# Patient Record
Sex: Female | Born: 1950 | Race: White | Hispanic: No | Marital: Married | Smoking: Never smoker
Health system: Southern US, Community
[De-identification: ages and names within clinical notes are randomized; demographics above are authoritative.]

## PROBLEM LIST (undated history)

## (undated) DIAGNOSIS — Z9889 Other specified postprocedural states: Secondary | ICD-10-CM

## (undated) DIAGNOSIS — M4800 Spinal stenosis, site unspecified: Secondary | ICD-10-CM

## (undated) DIAGNOSIS — I471 Supraventricular tachycardia, unspecified: Secondary | ICD-10-CM

## (undated) DIAGNOSIS — R112 Nausea with vomiting, unspecified: Secondary | ICD-10-CM

## (undated) DIAGNOSIS — I82409 Acute embolism and thrombosis of unspecified deep veins of unspecified lower extremity: Secondary | ICD-10-CM

## (undated) DIAGNOSIS — R7303 Prediabetes: Secondary | ICD-10-CM

## (undated) HISTORY — PX: ABDOMINAL HYSTERECTOMY: SHX81

## (undated) HISTORY — PX: CARDIAC CATHETERIZATION: SHX172

## (undated) HISTORY — PX: TONSILLECTOMY: SUR1361

---

## 2008-12-07 DIAGNOSIS — J189 Pneumonia, unspecified organism: Secondary | ICD-10-CM

## 2008-12-07 HISTORY — DX: Pneumonia, unspecified organism: J18.9

## 2016-02-05 HISTORY — PX: LAMINOTOMY: SHX998

## 2018-12-15 ENCOUNTER — Other Ambulatory Visit: Payer: Self-pay | Admitting: Neurosurgery

## 2018-12-27 ENCOUNTER — Other Ambulatory Visit: Payer: Self-pay | Admitting: Neurosurgery

## 2018-12-27 DIAGNOSIS — M4316 Spondylolisthesis, lumbar region: Secondary | ICD-10-CM

## 2018-12-29 NOTE — Pre-Procedure Instructions (Signed)
[facility]      [facility] - [place], [place] - [id number] [place] [id number] Phone: [phone] Fax: [phone]    Your procedure is scheduled on January 31st.  [facility] Ad[facility] at 5:30 A.M.  Call this number if you have problems the morning of surgery:  [phone]   Remember:  Do not eat or drink after midnight.     Take these medicines the morning of surgery with A SIP OF WATER   Tylenol - if needed  Atenolol (Tenormin)  Zyrtec - if needed  Gabapentin (Neurtontin)  Meclizine (Antivert) - if needed  Omeprazole (Prilosec)  Ondansetron (Zofran) - if needed   7 days prior to surgery STOP taking any Aspirin (unless otherwise instructed by your surgeon), Aleve, Naproxen, Ibuprofen, Motrin, Advil, Goody's, BC's, all herbal medications, fish oil, and all vitamins.   WHAT DO I DO ABOUT MY DIABETES MEDICATION?   Marland Kitchen. Do not take oral diabetes medicines (pills) the morning of surgery. - Metformin   How to Manage Your Diabetes Before and After Surgery  Why is it important to control my blood sugar before and after surgery? . Improving blood sugar levels before and after surgery helps healing and can limit problems. . A way of improving blood sugar control is eating a healthy diet by: o  Eating less sugar and carbohydrates o  Increasing activity/exercise o  Talking with your doctor about reaching your blood sugar goals . High blood sugars (greater than 180 mg/dL) can raise your risk of infections and slow your recovery, so you will need to focus on controlling your diabetes during the weeks before surgery. . Make sure that the doctor who takes care of your diabetes knows about your planned surgery including the date and location.  How do I manage my blood sugar before surgery? . Check your blood sugar at least 4 times a day, starting 2 days before surgery, to make sure that the level is not  too high or low. o Check your blood sugar the morning of your surgery when you wake up and every 2 hours until you get to the Short Stay unit. . If your blood sugar is less than 70 mg/dL, you will need to treat for low blood sugar: o Do not take insulin. o Treat a low blood sugar (less than 70 mg/dL) with  cup of clear juice (cranberry or apple), 4 glucose tablets, OR glucose gel. o Recheck blood sugar in 15 minutes after treatment (to make sure it is greater than 70 mg/dL). If your blood sugar is not greater than 70 mg/dL on recheck, call [phone] for further instructions. . Report your blood sugar to the short stay nurse when you get to Short Stay.  . If you are admitted to the hospital after surgery: o Your blood sugar will be checked by the staff and you will probably be given insulin after surgery (instead of oral diabetes medicines) to make sure you have good blood sugar levels. o The goal for blood sugar control after surgery is 80-180 mg/dL.      Do not wear jewelry, make-up or nail polish.  Do not wear lotions, powders, or perfumes, or deodorant.  Do not shave 48 hours prior to surgery.    Do not bring valuables to the hospital.  [facility] is not responsible for any belongings or valuables.   [place]- Preparing For Surgery  Before surgery, you can play an important role. Because skin is not sterile, your skin needs to be as free of germs as possible. You can reduce the number of germs on your skin by washing with CHG (chlorahexidine gluconate) Soap before surgery.  CHG is an antiseptic cleaner which kills germs and bonds with the skin to continue killing germs even after washing.    Oral Hygiene is also important to reduce your risk of infection.  Remember - BRUSH YOUR TEETH THE MORNING OF SURGERY WITH YOUR REGULAR TOOTHPASTE  Please do not use if you have an allergy to CHG or antibacterial soaps. If your skin becomes reddened/irritated stop using the CHG.  Do not  shave (including legs and underarms) for at least 48 hours prior to first CHG shower. It is OK to shave your face.  Please follow these instructions carefully.   1. Shower the NIGHT BEFORE SURGERY and the MORNING OF SURGERY with CHG.   2. If you chose to wash your hair, wash your hair first as usual with your normal shampoo.  3. After you shampoo, rinse your hair and body thoroughly to remove the shampoo.  4. Use CHG as you would any other liquid soap. You can apply CHG directly to the skin and wash gently with a scrungie or a clean washcloth.   5. Apply the CHG Soap to your body ONLY FROM THE NECK DOWN.  Do not use on open wounds or open sores. Avoid contact with your eyes, ears, mouth and genitals (private parts). Wash Face and genitals (private parts)  with your normal soap.  6. Wash thoroughly, paying special attention to the area where your surgery will be performed.  7. Thoroughly rinse your body with warm water from the neck down.  8. DO NOT shower/wash with your normal soap after using and rinsing off the CHG Soap.  9. Pat yourself dry with a CLEAN TOWEL.  10. Wear CLEAN PAJAMAS to bed the night before surgery, wear comfortable clothes the morning of surgery  11. Place CLEAN SHEETS on your bed the night of your first shower and DO NOT SLEEP WITH PETS.   Day of Surgery:  Do not apply any deodorants/lotions.  Please wear clean clothes to the hospital/surgery center.   Remember to brush your teeth WITH YOUR REGULAR TOOTHPASTE.   Contacts, dentures or bridgework may not be worn into surgery.  Leave your suitcase in the car.  After surgery it may be brought to your room.  For patients admitted to the hospital, discharge time will be determined by your treatment team.  Patients discharged the day of surgery will not be allowed to drive home.   Please read over the following fact sheets that you were given. Coughing and Deep Breathing, MRSA Information and Surgical Site  Infection Prevention

## 2018-12-30 ENCOUNTER — Encounter (HOSPITAL_COMMUNITY)
Admission: RE | Admit: 2018-12-30 | Discharge: 2018-12-30 | Disposition: A | Discharge status: Home / Self Care | Payer: Medicare HMO | Source: Ambulatory Visit | Attending: Neurosurgery | Admitting: Neurosurgery

## 2019-01-03 ENCOUNTER — Ambulatory Visit
Admission: RE | Admit: 2019-01-03 | Discharge: 2019-01-03 | Disposition: A | Discharge status: Home / Self Care | Payer: Medicare HMO | Source: Ambulatory Visit | Attending: Neurosurgery | Admitting: Neurosurgery

## 2019-01-03 DIAGNOSIS — M4316 Spondylolisthesis, lumbar region: Secondary | ICD-10-CM

## 2019-01-06 ENCOUNTER — Encounter (HOSPITAL_COMMUNITY): Admission: RE | Payer: Self-pay | Source: Home / Self Care

## 2019-01-06 ENCOUNTER — Inpatient Hospital Stay (HOSPITAL_COMMUNITY): Admission: RE | Admit: 2019-01-06 | Payer: Medicare HMO | Source: Home / Self Care | Admitting: Neurosurgery

## 2019-01-06 SURGERY — POSTERIOR LUMBAR FUSION 1 LEVEL
Anesthesia: General | Site: Back

## 2019-10-18 ENCOUNTER — Emergency Department (HOSPITAL_COMMUNITY): Payer: Medicare HMO

## 2019-10-18 ENCOUNTER — Other Ambulatory Visit: Payer: Self-pay

## 2019-10-18 ENCOUNTER — Observation Stay (HOSPITAL_COMMUNITY)
Admission: EM | Admit: 2019-10-18 | Discharge: 2019-10-19 | Disposition: A | Discharge status: Home / Self Care | Payer: Medicare HMO | Attending: Internal Medicine | Admitting: Internal Medicine

## 2019-10-18 ENCOUNTER — Encounter (HOSPITAL_COMMUNITY): Payer: Self-pay

## 2019-10-18 DIAGNOSIS — S335XXA Sprain of ligaments of lumbar spine, initial encounter: Principal | ICD-10-CM

## 2019-10-18 DIAGNOSIS — Z79899 Other long term (current) drug therapy: Secondary | ICD-10-CM | POA: Insufficient documentation

## 2019-10-18 DIAGNOSIS — Z7901 Long term (current) use of anticoagulants: Secondary | ICD-10-CM | POA: Diagnosis not present

## 2019-10-18 DIAGNOSIS — W07XXXA Fall from chair, initial encounter: Secondary | ICD-10-CM | POA: Insufficient documentation

## 2019-10-18 DIAGNOSIS — M549 Dorsalgia, unspecified: Secondary | ICD-10-CM | POA: Diagnosis present

## 2019-10-18 DIAGNOSIS — S0990XA Unspecified injury of head, initial encounter: Secondary | ICD-10-CM | POA: Diagnosis not present

## 2019-10-18 DIAGNOSIS — R7303 Prediabetes: Secondary | ICD-10-CM | POA: Insufficient documentation

## 2019-10-18 DIAGNOSIS — M48061 Spinal stenosis, lumbar region without neurogenic claudication: Secondary | ICD-10-CM | POA: Diagnosis not present

## 2019-10-18 DIAGNOSIS — Z20828 Contact with and (suspected) exposure to other viral communicable diseases: Secondary | ICD-10-CM | POA: Insufficient documentation

## 2019-10-18 DIAGNOSIS — G2581 Restless legs syndrome: Secondary | ICD-10-CM | POA: Diagnosis not present

## 2019-10-18 DIAGNOSIS — I10 Essential (primary) hypertension: Secondary | ICD-10-CM | POA: Diagnosis not present

## 2019-10-18 DIAGNOSIS — E119 Type 2 diabetes mellitus without complications: Secondary | ICD-10-CM

## 2019-10-18 DIAGNOSIS — Z7984 Long term (current) use of oral hypoglycemic drugs: Secondary | ICD-10-CM | POA: Diagnosis not present

## 2019-10-18 DIAGNOSIS — M47816 Spondylosis without myelopathy or radiculopathy, lumbar region: Secondary | ICD-10-CM | POA: Insufficient documentation

## 2019-10-18 DIAGNOSIS — N289 Disorder of kidney and ureter, unspecified: Secondary | ICD-10-CM

## 2019-10-18 DIAGNOSIS — Z86718 Personal history of other venous thrombosis and embolism: Secondary | ICD-10-CM | POA: Diagnosis not present

## 2019-10-18 DIAGNOSIS — G44309 Post-traumatic headache, unspecified, not intractable: Secondary | ICD-10-CM | POA: Insufficient documentation

## 2019-10-18 DIAGNOSIS — W19XXXA Unspecified fall, initial encounter: Secondary | ICD-10-CM

## 2019-10-18 HISTORY — DX: Spinal stenosis, site unspecified: M48.00

## 2019-10-18 HISTORY — DX: Acute embolism and thrombosis of unspecified deep veins of unspecified lower extremity: I82.409

## 2019-10-18 LAB — BASIC METABOLIC PANEL
Anion gap: 11 (ref 5–15)
BUN: 16 mg/dL (ref 8–23)
CO2: 22 mmol/L (ref 22–32)
Calcium: 9.3 mg/dL (ref 8.9–10.3)
Chloride: 105 mmol/L (ref 98–111)
Creatinine, Ser: 1.2 mg/dL — ABNORMAL HIGH (ref 0.44–1.00)
GFR calc Af Amer: 54 mL/min — ABNORMAL LOW (ref >60)
GFR calc non Af Amer: 46 mL/min — ABNORMAL LOW (ref >60)
Glucose, Bld: 116 mg/dL — ABNORMAL HIGH (ref 70–99)
Potassium: 4.6 mmol/L (ref 3.5–5.1)
Sodium: 138 mmol/L (ref 135–145)

## 2019-10-18 LAB — CBC
HCT: 39.9 % (ref 36.0–46.0)
Hemoglobin: 12.4 g/dL (ref 12.0–15.0)
MCH: 26.9 pg (ref 26.0–34.0)
MCHC: 31.1 g/dL (ref 30.0–36.0)
MCV: 86.6 fL (ref 80.0–100.0)
Platelets: 271 10*3/uL (ref 150–400)
RBC: 4.61 MIL/uL (ref 3.87–5.11)
RDW: 15.2 % (ref 11.5–15.5)
WBC: 6.8 10*3/uL (ref 4.0–10.5)
nRBC: 0 % (ref 0.0–0.2)

## 2019-10-18 MED ORDER — ROPINIROLE HCL 1 MG PO TABS
2.0000 mg | ORAL_TABLET | Freq: Once | ORAL | Status: AC
  Administered 2019-10-18: 2 mg via ORAL
  Filled 2019-10-18 (×2): qty 2

## 2019-10-18 MED ORDER — LACTATED RINGERS IV BOLUS
1000.0000 mL | Freq: Once | INTRAVENOUS | Status: AC
  Administered 2019-10-18: 23:00:00 1000 mL via INTRAVENOUS

## 2019-10-18 MED ORDER — FENTANYL CITRATE (PF) 100 MCG/2ML IJ SOLN
50.0000 ug | Freq: Once | INTRAMUSCULAR | Status: AC
  Administered 2019-10-18: 23:00:00 50 ug via INTRAVENOUS
  Filled 2019-10-18: qty 2

## 2019-10-18 MED ORDER — DEXAMETHASONE SODIUM PHOSPHATE 10 MG/ML IJ SOLN
10.0000 mg | Freq: Once | INTRAMUSCULAR | Status: AC
  Administered 2019-10-18: 23:00:00 10 mg via INTRAVENOUS
  Filled 2019-10-18: qty 1

## 2019-10-18 MED ORDER — GADOBUTROL 1 MMOL/ML IV SOLN
8.0000 mL | Freq: Once | INTRAVENOUS | Status: AC | PRN
  Administered 2019-10-18: 8 mL via INTRAVENOUS

## 2019-10-18 MED ORDER — LACTATED RINGERS IV BOLUS
1000.0000 mL | Freq: Once | INTRAVENOUS | Status: AC
  Administered 2019-10-18: 1000 mL via INTRAVENOUS

## 2019-10-18 MED ORDER — FENTANYL CITRATE (PF) 100 MCG/2ML IJ SOLN
50.0000 ug | Freq: Once | INTRAMUSCULAR | Status: AC
  Administered 2019-10-18: 50 ug via INTRAVENOUS
  Filled 2019-10-18: qty 2

## 2019-10-18 MED ORDER — ONDANSETRON HCL 4 MG/2ML IJ SOLN
4.0000 mg | Freq: Once | INTRAMUSCULAR | Status: AC
  Administered 2019-10-18: 4 mg via INTRAVENOUS
  Filled 2019-10-18: qty 2

## 2019-10-18 NOTE — ED Notes (Signed)
Called pharmacy requesting verification of medication

## 2019-10-18 NOTE — ED Notes (Signed)
Patient transported to MRI 

## 2019-10-18 NOTE — ED Triage Notes (Signed)
Pt endorses standing up in a chair at 0845 and the chair slipped out from under her and she fell onto her back striking her lumbar area and the back of her head. No loc. Pt is on eliquis for DVT. Complains of headache with nausea. Pt was dizzy when she first got up out of the floor. VSS

## 2019-10-18 NOTE — ED Provider Notes (Signed)
[facility] EMERGENCY DEPARTMENT Provider Note   CSN: [id number] Arrival date & time: 10/18/19  0957     History   Chief Complaint Chief Complaint  Patient presents with   Head Injury   Fall    HPI Diana Mcdonald is a 68 y.o. female.     The history is provided by the patient.  Fall This is a new problem. The current episode started 6 to 12 hours ago (8:15am). Associated symptoms include headaches. Pertinent negatives include no chest pain, no abdominal pain and no shortness of breath. Associated symptoms comments: Back pain, headache, nausea. The symptoms are aggravated by walking and twisting. Nothing relieves the symptoms.  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 She fell from a 9 foot height while setting up her Christmas tree this morning. She currently feels like it's hard to think, sick to her stomach, and has a headache with back pain.  Past Medical History:  Diagnosis Date   DVT (deep venous thrombosis) ([place])    Spinal stenosis     There are no active problems to display for this patient.   History reviewed. No pertinent surgical history.   OB History   No obstetric history on file.      Home Medications    Prior to Admission medications   Medication Sig Start Date End Date Taking? Authorizing Provider  acetaminophen (TYLENOL) 650  MG CR tablet Take 1,300 mg by mouth every 8 (eight) hours as needed for pain.   Yes [provider]  atenolol (TENORMIN) 50 MG tablet Take 25 mg by mouth at bedtime.    Yes [provider]  Calcium Carb-Cholecalciferol (CALCIUM 600/VITAMIN D3 PO) Take 1 tablet by mouth daily.   Yes [provider]  cetirizine (ZYRTEC) 10 MG tablet Take 10 mg by mouth daily.   Yes [provider]  Cholecalciferol (VITAMIN D3) 125 MCG (5000 UT) CAPS Take 5,000 Units by mouth daily.   Yes [provider]  diazepam (VALIUM) 2 MG tablet 2 mg See admin instructions. Take 2 mg sublingually as needed for vertigo, may repeat every 30 minutes until symptoms improve.  Do not exceed 4 tablets within 8 hours.   Yes [provider]  ELIQUIS 5 MG TABS tablet Take 5 mg by mouth 2 (two) times daily. 09/21/19  Yes [provider]  gabapentin (NEURONTIN) 300 MG capsule Take 300 mg by mouth 2 (two) times daily.    Yes [provider]  Magnesium 250 MG TABS Take 250 mg by mouth daily.   Yes [provider]  meclizine (ANTIVERT) 25 MG tablet Take 25 mg by mouth 3 (three) times daily as needed for dizziness.   Yes [provider]  metFORMIN (GLUCOPHAGE-XR) 500 MG 24 hr tablet Take 500 mg by mouth daily with supper.   Yes [provider]  omeprazole (  PRILOSEC) 40 MG capsule Take 40 mg by mouth daily.   Yes [provider]  ondansetron (ZOFRAN) 4 MG tablet Take 4 mg by mouth every 6 (six) hours as needed for nausea or vomiting.   Yes [provider]  OVER THE COUNTER MEDICATION Place 1 tablet under the tongue at bedtime as needed (restless legs). [place] Restful Legs PM   Yes [provider]  oxybutynin (DITROPAN-XL) 10 MG 24 hr tablet Take 10 mg by mouth daily. 09/19/19  Yes [provider]  rOPINIRole (REQUIP) 0.5 MG tablet Take 2 mg by mouth at bedtime.   Yes [provider]  traMADol (ULTRAM) 50  MG tablet Take 50 mg by mouth every 6 (six) hours as needed for moderate pain.   Yes [provider]  vitamin C (ASCORBIC ACID) 500 MG tablet Take 500 mg by mouth daily.   Yes [provider]    Family History History reviewed. No pertinent family history.  Social History Social History   Tobacco Use   Smoking status: Never Smoker  Substance Use Topics   Alcohol use: Yes    Frequency: Never    Comment: rare   Drug use: Never     Allergies   Patient has no known allergies.   Review of Systems Review of Systems  Constitutional: Negative for chills and fever.  Eyes: Negative for visual disturbance.  Respiratory: Negative for cough and shortness of breath.   Cardiovascular: Negative for chest pain.  Gastrointestinal: Positive for nausea. Negative for abdominal pain and vomiting.  Musculoskeletal: Positive for back pain and gait problem. Negative for arthralgias.  Skin: Negative for rash and wound.  Neurological: Positive for headaches. Negative for syncope.  All other systems reviewed and are negative.    Physical Exam Updated Vital Signs BP 104/64    Pulse 72    Temp 97.9 F (36.6 C) (Oral)    Resp 18    Ht 5\' 9"  (1.753 m)    Wt 97.5 kg    SpO2 95%    BMI 31.75 kg/m   Physical Exam Vitals signs and nursing note reviewed.  Constitutional:      Appearance: She is well-developed. She is obese.  HENT:     Head: Normocephalic and atraumatic.  Eyes:     Conjunctiva/sclera: Conjunctivae normal.  Neck:     Musculoskeletal: Neck supple. Muscular tenderness present.     Comments: Midline cervical neck tenderness to palpation, cervical collar in place during exam Cardiovascular:     Rate and Rhythm: Normal rate and regular rhythm.     Heart sounds: No murmur.  Pulmonary:     Effort: Pulmonary effort is normal. No respiratory distress.     Breath sounds: Normal breath sounds.  Abdominal:     General: There is no distension.     Palpations: Abdomen  is soft.     Tenderness: There is no abdominal tenderness. There is no guarding or rebound.     Comments: No peritoneal signs of the abdomen  Skin:    General: Skin is warm and dry.     Comments: No trauma, bruising visible  Neurological:     Mental Status: She is alert and oriented to person, place, and time.     Comments: Positive straight leg test bilaterally.  No step-offs or deformities on midline spine, extreme lumbar spine tenderness to palpation.  Subjective numbness present on left lower extremity.  Moving all 4 extremities spontaneously.      ED  Treatments / Results  Labs (all labs ordered are listed, but only abnormal results are displayed) Labs Reviewed  BASIC METABOLIC PANEL - Abnormal; Notable for the following components:      Result Value   Glucose, Bld 116 (*)    Creatinine, Ser 1.20 (*)    GFR calc non Af Amer 46 (*)    GFR calc Af Amer 54 (*)    All other components within normal limits  SARS CORONAVIRUS 2 (TAT 6-24 HRS)  CBC    EKG None  Radiology Ct Head Wo Contrast  Result Date: 10/18/2019 CLINICAL DATA:  Posttraumatic headache after fall. No loss of consciousness. EXAM: CT HEAD WITHOUT CONTRAST TECHNIQUE: Contiguous axial images were obtained from the base of the skull through the vertex without intravenous contrast. COMPARISON:  None. FINDINGS: Brain: No evidence of acute infarction, hemorrhage, hydrocephalus, extra-axial collection or mass lesion/mass effect. Vascular: No hyperdense vessel or unexpected calcification. Skull: Normal. Negative for fracture or focal lesion. Sinuses/Orbits: No acute finding. Other: None. IMPRESSION: Normal head CT. Electronically Signed   By: Lupita Raider M.D.   On: 10/18/2019 12:00   Ct Cervical Spine Wo Contrast  Result Date: 10/18/2019 CLINICAL DATA:  Post fall striking back of head. EXAM: CT CERVICAL SPINE WITHOUT CONTRAST TECHNIQUE: Multidetector CT imaging of the cervical spine was performed without intravenous  contrast. Multiplanar CT image reconstructions were also generated. COMPARISON:  None. FINDINGS: Alignment: Straightening of normal lordosis. No traumatic subluxation. Skull base and vertebrae: No acute fracture. Vertebral body heights are maintained. The dens and skull base are intact. Soft tissues and spinal canal: No prevertebral fluid or swelling. No visible canal hematoma. Disc levels: Multilevel disc space narrowing and endplate spurring, most prominent at C5-C6. Scattered facet hypertrophy. Upper chest: No acute finding. Other: None. IMPRESSION: Multilevel degenerative change in the cervical spine without acute fracture or subluxation. Electronically Signed   By: Narda Rutherford M.D.   On: 10/18/2019 18:51   Ct Lumbar Spine Wo Contrast  Result Date: 10/18/2019 CLINICAL DATA:  Pain status post fall EXAM: CT LUMBAR SPINE WITHOUT CONTRAST TECHNIQUE: Multidetector CT imaging of the lumbar spine was performed without intravenous contrast administration. Multiplanar CT image reconstructions were also generated. COMPARISON:  CT dated January 03, 2019 FINDINGS: Segmentation: 5 lumbar type vertebrae. Alignment: Normal. Vertebrae: There is no displaced fracture. No dislocation. There is unchanged grade 1 anterolisthesis of L4 on L5 measuring approximately 4 mm. This is presumably degenerative in etiology. Multilevel disc height loss is noted, greatest at the T12-L1 and L1-L2 levels. Multilevel facet arthrosis is noted, greatest at the lower lumbar segments. Paraspinal and other soft tissues: Negative. Disc levels: Multilevel disc height loss is noted as detailed above. IMPRESSION: 1. No evidence of acute fracture or dislocation of the lumbar spine. 2. Multilevel degenerative changes as detailed above. Electronically Signed   By: Katherine Mantle M.D.   On: 10/18/2019 18:53   Mr Lumbar Spine W Wo Contrast  Result Date: 10/18/2019 CLINICAL DATA:  Back pain after a fall EXAM: MRI LUMBAR SPINE WITHOUT AND WITH  CONTRAST TECHNIQUE: Multiplanar and multiecho pulse sequences of the lumbar spine were obtained without and with intravenous contrast. CONTRAST:  8mL GADAVIST GADOBUTROL 1 MMOL/ML IV SOLN COMPARISON:  None. FINDINGS: Segmentation: There are 5 non-rib bearing lumbar type vertebral bodies with the last intervertebral disc space labeled as L5-S1. Alignment: There is a grade 1 anterolisthesis of L4 on L5 measuring 4 mm. There is a minimal retrolisthesis of L1 on L2. Vertebrae:  The vertebral body heights are well maintained. No fracture, marrow edema,or pathologic marrow infiltration. There is mildly increased STIR signal seen at the anterior endplate L1-L2. Conus medullaris and cauda equina: Conus extends to the L1 level. Conus and cauda equina appear normal. No abnormal intramedullary or leptomeningeal enhancement seen. Paraspinal and other soft tissues: There is mild edema seen within the paraspinal subcutaneous tissues from L2 through L4. There is mildly increased signal with a small amount of fluid seen between the inter spinous ligament at L2-L3 and L3-L4. No widening of the interspinous ligament space however is seen. There is a T2 bright cystic lesion seen within the left kidney. The retroperitoneal structures are unremarkable. The sacroiliac joints are intact. Disc levels: T12-L1:  No significant canal or neural foraminal narrowing. L1-L2: There is a minimal broad-based disc bulge with facet arthrosis which causes mild bilateral neural foraminal narrowing. L2-L3: There is a broad-based disc bulge with facet arthrosis and ligamentum flavum hypertrophy which causes mild to moderate bilateral neural foraminal narrowing. There is mild effacement anterior thecal sac. L3-L4: There is a broad-based disc bulge with facet arthrosis which causes moderate bilateral neural foraminal narrowing. There is mild effacement anterior thecal sac. L4-L5: There is a broad-based disc bulge with disc uncovering and facet arthrosis.  There is moderate bilateral neural foraminal narrowing. The central thecal sac measures 6 mm in AP diameter. L5-S1: Broad-based disc bulge with facet arthrosis which causes moderate to severe left and moderate right neural foraminal narrowing. IMPRESSION: 1. Grade 1 anterolisthesis of L4 on L5 and a minimal retrolisthesis of L1 on L2. 2. Intraspinous ligamentous edema at L2-L3 and L3-L4, suggestive ligamentous sprain. Mild overlying subcutaneous edema. 3. No acute fracture or malalignment. 4. 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 Electronically Signed   By: Jonna Clark M.D.   On: 10/18/2019 22:09    Procedures Procedures (including critical care time)  Medications Ordered in ED Medications  dexamethasone (DECADRON) injection 10 mg (has no administration in time range)  fentaNYL (SUBLIMAZE) injection 50 mcg (has no administration in time range)  fentaNYL (SUBLIMAZE) injection 50 mcg (50 mcg Intravenous Given 10/18/19 1641)  ondansetron (ZOFRAN) injection 4 mg (4 mg Intravenous Given 10/18/19 1641)  lactated ringers bolus 1,000 mL (0 mLs Intravenous Stopped 10/18/19 1927)  rOPINIRole (REQUIP) tablet 2 mg (2 mg Oral Given 10/18/19 2255)  gadobutrol (GADAVIST) 1 MMOL/ML injection 8 mL (8 mLs Intravenous Contrast Given 10/18/19 2156)  fentaNYL (SUBLIMAZE) injection 50 mcg (50 mcg Intravenous Given 10/18/19 2202)  lactated ringers bolus 1,000 mL (1,000 mLs Intravenous New Bag/Given 10/18/19 2301)     Initial Impression / Assessment and Plan / ED Course  I have reviewed the triage vital signs and the nursing notes.  Pertinent labs & imaging results that were available during my care of the patient were reviewed by me and considered in my medical decision making (see chart for details).        Diana Mcdonald is a 68 y.o. female with a past medical history of previous unprovoked DVT on Eliquis,  spinal stenosis followed by neurosurgery presenting today for follow-up from 9 feet onto her back.  She states that she currently has spinal pain in her cervical and lumbar regions.  Labs and imaging ordered to evaluate for intracranial bleeding, electrolyte abnormality, anemia, compression fracture.  Initial exam is also consistent with a concussion.  Pain medicine and nausea medicine ordered.  Labs showed renal  insufficiency, for which an LR bolus is noted.  CT head did not show any acute intracranial abnormality.  CT C-spine and L-spine did not show any fractures, but MRI ordered considering continued left lower extremity numbness and extreme pain that has required multiple doses of fentanyl.  MRI showed ligamental strain and edema, but no traction or nerve root impingement. She has known chronic spinal problems that she follows with Dr. Wynetta Emeryram in neurosurgery for.  Neurosurgery consulted considering continued pain and numbness.  They recommend IV Decadron 10 mg, 6-day Medrol Dosepak.  Patient is attempted to be ambulated, she is in extreme pain when standing up and almost cries.  Further pain medicine is ordered.  At this time, do not feel patient is safe to go home as she cannot even stand.  Admitted to medicine for pain management and possible rehab.  Care patient discussed with the supervising attending.  Final Clinical Impressions(s) / ED Diagnoses   Final diagnoses:  Fall, initial encounter  Sprain of ligaments of lumbar spine, initial encounter  Renal insufficiency    ED Discharge Orders    None       Chester HolsteinVaithi, Leverett Camplin, MD 10/18/19 2325    Melene PlanFloyd, Dan, DO 10/20/19 2302

## 2019-10-18 NOTE — ED Notes (Signed)
Patient transported to CT 

## 2019-10-19 DIAGNOSIS — M549 Dorsalgia, unspecified: Secondary | ICD-10-CM | POA: Diagnosis not present

## 2019-10-19 DIAGNOSIS — R7303 Prediabetes: Secondary | ICD-10-CM | POA: Diagnosis present

## 2019-10-19 LAB — SARS CORONAVIRUS 2 (TAT 6-24 HRS): SARS Coronavirus 2: NEGATIVE

## 2019-10-19 MED ORDER — ONDANSETRON HCL 4 MG/2ML IJ SOLN: 4.0000 mg | Freq: Four times a day (QID) | INTRAMUSCULAR | Status: DC | PRN

## 2019-10-19 MED ORDER — GABAPENTIN 300 MG PO CAPS
300.0000 mg | ORAL_CAPSULE | Freq: Two times a day (BID) | ORAL | Status: DC
  Administered 2019-10-19 (×2): 300 mg via ORAL
  Filled 2019-10-19 (×2): qty 1

## 2019-10-19 MED ORDER — MECLIZINE HCL 25 MG PO TABS
25.0000 mg | ORAL_TABLET | Freq: Three times a day (TID) | ORAL | Status: DC | PRN
  Filled 2019-10-19: qty 1

## 2019-10-19 MED ORDER — VITAMIN C 500 MG PO TABS
500.0000 mg | ORAL_TABLET | Freq: Every day | ORAL | Status: DC
  Administered 2019-10-19: 500 mg via ORAL
  Filled 2019-10-19: qty 1

## 2019-10-19 MED ORDER — PREDNISOLONE 5 MG PO TABS
5.0000 mg | ORAL_TABLET | Freq: Two times a day (BID) | ORAL | Status: DC
  Administered 2019-10-19: 5 mg via ORAL
  Filled 2019-10-19 (×3): qty 1

## 2019-10-19 MED ORDER — TRAMADOL HCL 50 MG PO TABS: 50.0000 mg | ORAL_TABLET | Freq: Four times a day (QID) | ORAL | 0 refills | Status: DC | PRN

## 2019-10-19 MED ORDER — ROPINIROLE HCL 0.5 MG PO TABS
2.0000 mg | ORAL_TABLET | Freq: Every day | ORAL | Status: DC
  Filled 2019-10-19: qty 2

## 2019-10-19 MED ORDER — DIAZEPAM 5 MG/ML IJ SOLN: 2.5000 mg | INTRAMUSCULAR | Status: DC

## 2019-10-19 MED ORDER — ONDANSETRON HCL 4 MG PO TABS: 4.0000 mg | ORAL_TABLET | Freq: Four times a day (QID) | ORAL | Status: DC | PRN

## 2019-10-19 MED ORDER — APIXABAN 5 MG PO TABS
5.0000 mg | ORAL_TABLET | Freq: Two times a day (BID) | ORAL | Status: DC
  Administered 2019-10-19 (×2): 5 mg via ORAL
  Filled 2019-10-19 (×3): qty 1

## 2019-10-19 MED ORDER — HYDROMORPHONE HCL 1 MG/ML IJ SOLN: 1.0000 mg | INTRAMUSCULAR | Status: DC | PRN

## 2019-10-19 MED ORDER — ACETAMINOPHEN 500 MG PO TABS
1000.0000 mg | ORAL_TABLET | Freq: Three times a day (TID) | ORAL | Status: DC
  Administered 2019-10-19 (×2): 1000 mg via ORAL
  Filled 2019-10-19 (×2): qty 2

## 2019-10-19 MED ORDER — CALCIUM-VITAMIN D 500-200 MG-UNIT PO TABS
1.0000 | ORAL_TABLET | Freq: Every day | ORAL | Status: DC
  Filled 2019-10-19 (×2): qty 1

## 2019-10-19 MED ORDER — ONDANSETRON HCL 4 MG/2ML IJ SOLN
4.0000 mg | Freq: Once | INTRAMUSCULAR | Status: AC
  Administered 2019-10-19: 4 mg via INTRAVENOUS
  Filled 2019-10-19: qty 2

## 2019-10-19 MED ORDER — LORATADINE 10 MG PO TABS
10.0000 mg | ORAL_TABLET | Freq: Every day | ORAL | Status: DC
  Administered 2019-10-19: 10 mg via ORAL
  Filled 2019-10-19: qty 1

## 2019-10-19 MED ORDER — METHYLPREDNISOLONE 4 MG PO TBPK: ORAL_TABLET | ORAL | 0 refills | Status: DC

## 2019-10-19 MED ORDER — LIDOCAINE 5 % EX PTCH: 1.0000 | MEDICATED_PATCH | CUTANEOUS | 0 refills | Status: AC

## 2019-10-19 MED ORDER — TRAMADOL HCL 50 MG PO TABS
50.0000 mg | ORAL_TABLET | Freq: Four times a day (QID) | ORAL | Status: DC | PRN
  Administered 2019-10-19 (×2): 50 mg via ORAL
  Filled 2019-10-19 (×2): qty 1

## 2019-10-19 MED ORDER — OXYBUTYNIN CHLORIDE ER 10 MG PO TB24
10.0000 mg | ORAL_TABLET | Freq: Every day | ORAL | Status: DC
  Administered 2019-10-19: 10 mg via ORAL
  Filled 2019-10-19: qty 1

## 2019-10-19 MED ORDER — LIDOCAINE 5 % EX PTCH
1.0000 | MEDICATED_PATCH | CUTANEOUS | Status: DC
  Administered 2019-10-19: 1 via TRANSDERMAL
  Filled 2019-10-19: qty 1

## 2019-10-19 MED ORDER — MAGNESIUM OXIDE 400 (241.3 MG) MG PO TABS
200.0000 mg | ORAL_TABLET | Freq: Every day | ORAL | Status: DC
  Filled 2019-10-19: qty 1

## 2019-10-19 MED ORDER — OMEPRAZOLE 40 MG PO CPDR: 40.0000 mg | DELAYED_RELEASE_CAPSULE | Freq: Two times a day (BID) | ORAL | Status: AC

## 2019-10-19 MED ORDER — KETOROLAC TROMETHAMINE 15 MG/ML IJ SOLN: 15.0000 mg | Freq: Four times a day (QID) | INTRAMUSCULAR | Status: DC | PRN

## 2019-10-19 MED ORDER — ATENOLOL 25 MG PO TABS: 25.0000 mg | ORAL_TABLET | Freq: Every day | ORAL | Status: DC

## 2019-10-19 MED ORDER — HYDROCODONE-ACETAMINOPHEN 5-325 MG PO TABS
1.0000 | ORAL_TABLET | Freq: Four times a day (QID) | ORAL | Status: DC | PRN
  Filled 2019-10-19: qty 1

## 2019-10-19 MED ORDER — SODIUM CHLORIDE 0.9 % IV SOLN
INTRAVENOUS | Status: DC
  Administered 2019-10-19: 01:00:00 via INTRAVENOUS

## 2019-10-19 MED ORDER — PANTOPRAZOLE SODIUM 40 MG PO TBEC
40.0000 mg | DELAYED_RELEASE_TABLET | Freq: Every day | ORAL | Status: DC
  Administered 2019-10-19: 40 mg via ORAL
  Filled 2019-10-19: qty 1

## 2019-10-19 NOTE — ED Notes (Signed)
RN for 5N14 not available for report. Will call back in 5 to 10 minutes

## 2019-10-19 NOTE — Plan of Care (Signed)

## 2019-10-19 NOTE — H&P (Signed)
History and Physical    Brianda Beitler RUE:[id number] DOB: 01-18-1951 DOA: 10/18/2019  PCP: Tempie Donning, MD    Patient coming from: Home    Chief Complaint:  Pain lower back, nausea  HPI: Diana Mcdonald is a 68 y.o. female with medical history significant of prediabetes, hypertension, DVT, came with a chief complaint of headache, lower back pain nausea.  Patient states fell trying to fix a Christmas tree.  She fell on her back and hit her head. She has history of DVT and she is on Eliquis Patient is complaining of severe lower back pain and numbness and diminished sensory left leg.  ED Course:  In the emergency room she was complaining of severe lower back pain and was given 2 doses of fentanyl. She had a CT scan of the head and C-spine which was negative CT LS spine Spinal stenosis with degenerative disc disease MRI LS spine mild edema with sprain of ligaments of lumbar spine Because of severe pain and unable to ambulate will need admitted for observation.   Review of Systems: As per HPI otherwise 10 point review of systems negative.  Except headache, nausea, lower back pain  Past Medical History:  Diagnosis Date   DVT (deep venous thrombosis) (HCC)    Spinal stenosis     History reviewed. No pertinent surgical history.   reports that she has never smoked. She does not have any smokeless tobacco history on file. She reports current alcohol use. She reports that she does not use drugs.  No Known Allergies  History reviewed. No pertinent family history.   Prior to Admission medications   Medication Sig Start Date End Date Taking? Authorizing Provider  acetaminophen (TYLENOL) 650 MG CR tablet Take 1,300 mg by mouth every 8 (eight) hours as needed for pain.   Yes [provider]  atenolol (TENORMIN) 50 MG tablet Take 25 mg by mouth at bedtime.    Yes [provider]  Calcium Carb-Cholecalciferol (CALCIUM 600/VITAMIN D3 PO) Take 1 tablet by  mouth daily.   Yes [provider]  cetirizine (ZYRTEC) 10 MG tablet Take 10 mg by mouth daily.   Yes [provider]  Cholecalciferol (VITAMIN D3) 125 MCG (5000 UT) CAPS Take 5,000 Units by mouth daily.   Yes [provider]  diazepam (VALIUM) 2 MG tablet 2 mg See admin instructions. Take 2 mg sublingually as needed for vertigo, may repeat every 30 minutes until symptoms improve.  Do not exceed 4 tablets within 8 hours.   Yes [provider]  ELIQUIS 5 MG TABS tablet Take 5 mg by mouth 2 (two) times daily. 09/21/19  Yes [provider]  gabapentin (NEURONTIN) 300 MG capsule Take 300 mg by mouth 2 (two) times daily.    Yes [provider]  Magnesium 250 MG TABS Take 250 mg by mouth daily.   Yes [provider]  meclizine (ANTIVERT) 25 MG tablet Take 25 mg by mouth 3 (three) times daily as needed for dizziness.   Yes [provider]  metFORMIN (GLUCOPHAGE-XR) 500 MG 24 hr tablet Take 500 mg by mouth daily with supper.   Yes [provider]  omeprazole (PRILOSEC) 40 MG capsule Take 40 mg by mouth daily.   Yes [provider]  ondansetron (ZOFRAN) 4 MG tablet Take 4 mg by mouth every 6 (six) hours as needed for nausea or vomiting.   Yes [provider]  OVER THE COUNTER MEDICATION Place 1 tablet under the tongue  at bedtime as needed (restless legs). [place] Restful Legs PM   Yes [provider]  oxybutynin (DITROPAN-XL) 10 MG 24 hr tablet Take 10 mg by mouth daily. 09/19/19  Yes [provider]  rOPINIRole (REQUIP) 0.5 MG tablet Take 2 mg by mouth at bedtime.   Yes [provider]  traMADol (ULTRAM) 50 MG tablet Take 50 mg by mouth every 6 (six) hours as needed for moderate pain.   Yes [provider]  vitamin C (ASCORBIC ACID) 500 MG tablet Take 500 mg by mouth daily.   Yes [provider]    Physical Exam: Vitals:   10/18/19 2330 10/19/19 0015 10/19/19  0030 10/19/19 0032  BP: 128/70 124/61 (!) 131/56 131/71  Pulse: 73 78 67 63  Resp: 17   20  Temp:    98.2 F (36.8 C)  TempSrc:    Oral  SpO2: 94% (!) 89% (!) 88% 96%  Weight:      Height:        Constitutional: NAD, calm, comfortable Vitals:   10/18/19 2330 10/19/19 0015 10/19/19 0030 10/19/19 0032  BP: 128/70 124/61 (!) 131/56 131/71  Pulse: 73 78 67 63  Resp: 17   20  Temp:    98.2 F (36.8 C)  TempSrc:    Oral  SpO2: 94% (!) 89% (!) 88% 96%  Weight:      Height:       Eyes: PERRL, lids and conjunctivae normal ENMT: Mucous membranes are moist. Posterior pharynx clear of any exudate or lesions.Normal dentition.  Neck: normal, supple, no masses, no thyromegaly Respiratory: clear to auscultation bilaterally, no wheezing, no crackles. Normal respiratory effort. No accessory muscle use.  Cardiovascular: Regular rate and rhythm, no murmurs / rubs / gallops. No extremity edema. 2+ pedal pulses. No carotid bruits.  Abdomen: no tenderness, no masses palpated. No hepatosplenomegaly. Bowel sounds positive.  Musculoskeletal: +++ Lower back tenderness Skin: no rashes, lesions, ulcers. No induration Neurologic: CN 2-12 grossly intact. Sensation intact, DTR normal. Strength 5/5 in all 4. +++ Numbness left leg Psychiatric: Normal judgment and insight. Alert and oriented x 3. Normal mood.    Labs on Admission: I have personally reviewed following labs and imaging studies  CBC: Recent Labs  Lab 10/18/19 1011  WBC 6.8  HGB 12.4  HCT 39.9  MCV 86.6  PLT 938   Basic Metabolic Panel: Recent Labs  Lab 10/18/19 1011  NA 138  K 4.6  CL 105  CO2 22  GLUCOSE 116*  BUN 16  CREATININE 1.20*  CALCIUM 9.3   GFR: Estimated Creatinine Clearance: 55.7 mL/min (A) (by C-G formula based on SCr of 1.2 mg/dL (H)). Liver Function Tests: No results for input(s): AST, ALT, ALKPHOS, BILITOT, PROT, ALBUMIN in the last 168 hours. No results for input(s): LIPASE, AMYLASE in the last 168  hours. No results for input(s): AMMONIA in the last 168 hours. Coagulation Profile: No results for input(s): INR, PROTIME in the last 168 hours. Cardiac Enzymes: No results for input(s): CKTOTAL, CKMB, CKMBINDEX, TROPONINI in the last 168 hours. BNP (last 3 results) No results for input(s): PROBNP in the last 8760 hours. HbA1C: No results for input(s): HGBA1C in the last 72 hours. CBG: No results for input(s): GLUCAP in the last 168 hours. Lipid Profile: No results for input(s): CHOL, HDL, LDLCALC, TRIG, CHOLHDL, LDLDIRECT in the last 72 hours. Thyroid Function Tests: No results for input(s): TSH, T4TOTAL, FREET4, T3FREE, THYROIDAB in the last 72 hours. Anemia Panel: No results  for input(s): VITAMINB12, FOLATE, FERRITIN, TIBC, IRON, RETICCTPCT in the last 72 hours. Urine analysis: No results found for: COLORURINE, APPEARANCEUR, LABSPEC, PHURINE, GLUCOSEU, HGBUR, BILIRUBINUR, KETONESUR, PROTEINUR, UROBILINOGEN, NITRITE, LEUKOCYTESUR  Radiological Exams on Admission: Ct Head Wo Contrast  Result Date: 10/18/2019 CLINICAL DATA:  Posttraumatic headache after fall. No loss of consciousness. EXAM: CT HEAD WITHOUT CONTRAST TECHNIQUE: Contiguous axial images were obtained from the base of the skull through the vertex without intravenous contrast. COMPARISON:  None. FINDINGS: Brain: No evidence of acute infarction, hemorrhage, hydrocephalus, extra-axial collection or mass lesion/mass effect. Vascular: No hyperdense vessel or unexpected calcification. Skull: Normal. Negative for fracture or focal lesion. Sinuses/Orbits: No acute finding. Other: None. IMPRESSION: Normal head CT. Electronically Signed   By: Lupita Raider M.D.   On: 10/18/2019 12:00   Ct Cervical Spine Wo Contrast  Result Date: 10/18/2019 CLINICAL DATA:  Post fall striking back of head. EXAM: CT CERVICAL SPINE WITHOUT CONTRAST TECHNIQUE: Multidetector CT imaging of the cervical spine was performed without intravenous contrast.  Multiplanar CT image reconstructions were also generated. COMPARISON:  None. FINDINGS: Alignment: Straightening of normal lordosis. No traumatic subluxation. Skull base and vertebrae: No acute fracture. Vertebral body heights are maintained. The dens and skull base are intact. Soft tissues and spinal canal: No prevertebral fluid or swelling. No visible canal hematoma. Disc levels: Multilevel disc space narrowing and endplate spurring, most prominent at C5-C6. Scattered facet hypertrophy. Upper chest: No acute finding. Other: None. IMPRESSION: Multilevel degenerative change in the cervical spine without acute fracture or subluxation. Electronically Signed   By: Narda Rutherford M.D.   On: 10/18/2019 18:51   Ct Lumbar Spine Wo Contrast  Result Date: 10/18/2019 CLINICAL DATA:  Pain status post fall EXAM: CT LUMBAR SPINE WITHOUT CONTRAST TECHNIQUE: Multidetector CT imaging of the lumbar spine was performed without intravenous contrast administration. Multiplanar CT image reconstructions were also generated. COMPARISON:  CT dated January 03, 2019 FINDINGS: Segmentation: 5 lumbar type vertebrae. Alignment: Normal. Vertebrae: There is no displaced fracture. No dislocation. There is unchanged grade 1 anterolisthesis of L4 on L5 measuring approximately 4 mm. This is presumably degenerative in etiology. Multilevel disc height loss is noted, greatest at the T12-L1 and L1-L2 levels. Multilevel facet arthrosis is noted, greatest at the lower lumbar segments. Paraspinal and other soft tissues: Negative. Disc levels: Multilevel disc height loss is noted as detailed above. IMPRESSION: 1. No evidence of acute fracture or dislocation of the lumbar spine. 2. Multilevel degenerative changes as detailed above. Electronically Signed   By: Katherine Mantle M.D.   On: 10/18/2019 18:53   Mr Lumbar Spine W Wo Contrast  Result Date: 10/18/2019 CLINICAL DATA:  Back pain after a fall EXAM: MRI LUMBAR SPINE WITHOUT AND WITH CONTRAST  TECHNIQUE: Multiplanar and multiecho pulse sequences of the lumbar spine were obtained without and with intravenous contrast. CONTRAST:  69mL GADAVIST GADOBUTROL 1 MMOL/ML IV SOLN COMPARISON:  None. FINDINGS: Segmentation: There are 5 non-rib bearing lumbar type vertebral bodies with the last intervertebral disc space labeled as L5-S1. Alignment: There is a grade 1 anterolisthesis of L4 on L5 measuring 4 mm. There is a minimal retrolisthesis of L1 on L2. Vertebrae: The vertebral body heights are well maintained. No fracture, marrow edema,or pathologic marrow infiltration. There is mildly increased STIR signal seen at the anterior endplate L1-L2. Conus medullaris and cauda equina: Conus extends to the L1 level. Conus and cauda equina appear normal. No abnormal intramedullary or leptomeningeal enhancement seen. Paraspinal and other soft tissues: There is  mild edema seen within the paraspinal subcutaneous tissues from L2 through L4. There is mildly increased signal with a small amount of fluid seen between the inter spinous ligament at L2-L3 and L3-L4. No widening of the interspinous ligament space however is seen. There is a T2 bright cystic lesion seen within the left kidney. The retroperitoneal structures are unremarkable. The sacroiliac joints are intact. Disc levels: T12-L1:  No significant canal or neural foraminal narrowing. L1-L2: There is a minimal broad-based disc bulge with facet arthrosis which causes mild bilateral neural foraminal narrowing. L2-L3: There is a broad-based disc bulge with facet arthrosis and ligamentum flavum hypertrophy which causes mild to moderate bilateral neural foraminal narrowing. There is mild effacement anterior thecal sac. L3-L4: There is a broad-based disc bulge with facet arthrosis which causes moderate bilateral neural foraminal narrowing. There is mild effacement anterior thecal sac. L4-L5: There is a broad-based disc bulge with disc uncovering and facet arthrosis. There is  moderate bilateral neural foraminal narrowing. The central thecal sac measures 6 mm in AP diameter. L5-S1: Broad-based disc bulge with facet arthrosis which causes moderate to severe left and moderate right neural foraminal narrowing. IMPRESSION: 1. Grade 1 anterolisthesis of L4 on L5 and a minimal retrolisthesis of L1 on L2. 2. Intraspinous ligamentous edema at L2-L3 and L3-L4, suggestive ligamentous sprain. Mild overlying subcutaneous edema. 3. No acute fracture or malalignment. 4. 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 Electronically Signed   By: Jonna ClarkBindu  Avutu M.D.   On: 10/18/2019 22:09    EKG: Independently reviewed.   Assessment/Plan Principal Problem:   Intractable back pain Active Problems:   Type 2 diabetes mellitus without complication (HCC)   Essential hypertension   History of DVT (deep vein thrombosis)   Restless leg syndrome    Assessment plan Intractable back pain Status post fall Patient with history of spinal stenosis MRI lumbar spine Paraspinal and other soft tissues: There is mild edema seen within the paraspinal subcutaneous tissues from L2 through L4. There is mildly increased signal with a small amount of fluid seen between the inter spinous ligament at L2-L3 and L3-L4. No widening of the interspinous ligament space however is seen. There is a T2 bright cystic lesion seen within the left kidney. The retroperitoneal structures are unremarkable. The sacroiliac joints are intact. Plan pain medication Patient received 10 mg of IV Decadron in the emergency room Neurosurgery recommended the Medrol pack for discharge  Minor head injury Status post fall CT scan negative Plan neurochecks  Diabetes mellitus type 2 Patient states she takes Metformin For prediabetes Plan Accu-Cheks every 6  Essential hypertension Resume Tenormin  History of DVT right  leg Eliquis  Restless leg syndrome Requip    DVT prophylaxis: Lovenox Code Status: Full code Family Communication: Husband in the room Disposition Plan: Discharge home Consults called: Neurosurgery by ED Admission status: Observation   Eriyanna Kofoed G Lem Peary MD [facility]  If 7PM-7AM, please contact night-coverage [email or web address]   10/19/2019, 2:17 AM

## 2019-10-19 NOTE — Discharge Instructions (Signed)

## 2019-10-19 NOTE — Progress Notes (Signed)
Pt c/o pain "like electricity" going down her left leg when she went to the bathroom. And she had forgot to mention to Dr Broadus John that she had some edema in her feet that she doesn't have normally.  Dr Broadus John was paged and informed of the above.  Return call and information relayed to the pt from Dr Broadus John, was that the pain was from neuropathy, and that pt had received several boluses of IV fluids in the ER last night and that was contributing to her swelling in her feet. Pt also instructed to call her PCP if swelling got worse or pain got worse.  At 1624 pt given ultram for her headache and back pain, was going to rest at this time.

## 2019-10-19 NOTE — ED Notes (Signed)
ED TO INPATIENT HANDOFF REPORT  ED Nurse Name and Phone #:  [phone]  S Name/Age/Gender Diana Mcdonald 68 y.o. female Room/Bed: 023C/023C  Code Status   Code Status: Full Code  Home/SNF/Other Home Patient oriented to: self, place, time and situation Is this baseline? Yes   Triage Complete: Triage complete  Chief Complaint FALL HIT HEAD AND BACK  Triage Note Pt endorses standing up in a chair at 0845 and the chair slipped out from under her and she fell onto her back striking her lumbar area and the back of her head. No loc. Pt is on eliquis for DVT. Complains of headache with nausea. Pt was dizzy when she first got up out of the floor. VSS   Allergies No Known Allergies  Level of Care/Admitting Diagnosis ED Disposition    ED Disposition Condition [facility] Area: [place] [100100]  Level of Care: Med-Surg [16]  I expect the patient will be discharged within 24 hours: No (not a candidate for 5C-Observation unit)  Covid Evaluation: Asymptomatic Screening Protocol (No Symptoms)  Diagnosis: Intractable back pain [720110]  Admitting Physician: Assunta Found [[id number]]  Attending Physician: Assunta Found [phone]  PT Class (Do Not Modify): Observation [104]  PT Acc Code (Do Not Modify): Observation [10022]       B Medical/Surgery History Past Medical History:  Diagnosis Date  . DVT (deep venous thrombosis) ([place])   . Spinal stenosis    History reviewed. No pertinent surgical history.   A IV Location/Drains/Wounds Patient Lines/Drains/Airways Status   Active Line/Drains/Airways    Name:   Placement date:   Placement time:   Site:   Days:   Peripheral IV 10/18/19 Right Antecubital   10/18/19    1632    Antecubital   1          Intake/Output Last 24 hours  Intake/Output Summary (Last 24 hours) at 10/19/2019 0355 Last data filed at 10/18/2019 1927 Gross per 24 hour  Intake 1000 ml  Output -  Net 1000 ml     Labs/Imaging Results for orders placed or performed during the hospital encounter of 10/18/19 (from the past 48 hour(s))  CBC     Status: None   Collection Time: 10/18/19 10:11 AM  Result Value Ref Range   WBC 6.8 4.0 - 10.5 K/uL   RBC 4.61 3.87 - 5.11 MIL/uL   Hemoglobin 12.4 12.0 - 15.0 g/dL   HCT 39.9 36.0 - 46.0 %   MCV 86.6 80.0 - 100.0 fL   MCH 26.9 26.0 - 34.0 pg   MCHC 31.1 30.0 - 36.0 g/dL   RDW 15.2 11.5 - 15.5 %   Platelets 271 150 - 400 K/uL   nRBC 0.0 0.0 - 0.2 %    Comment: Performed at [facility], [place] [address], [place], [place] [id number]  Basic metabolic panel     Status: Abnormal   Collection Time: 10/18/19 10:11 AM  Result Value Ref Range   Sodium 138 135 - 145 mmol/L   Potassium 4.6 3.5 - 5.1 mmol/L   Chloride 105 98 - 111 mmol/L   CO2 22 22 - 32 mmol/L   Glucose, Bld 116 (H) 70 - 99 mg/dL   BUN 16 8 - 23 mg/dL   Creatinine, Ser 1.20 (H) 0.44 - 1.00 mg/dL   Calcium 9.3 8.9 - 10.3 mg/dL   GFR calc non Af Amer 46 (L) >60 mL/min   GFR calc Af  Amer 54 (L) >60 mL/min   Anion gap 11 5 - 15    Comment: Performed at [facility], [address] [place], [place] [id number]   Ct Head Wo Contrast  Result Date: 10/18/2019 CLINICAL DATA:  Posttraumatic headache after fall. No loss of consciousness. EXAM: CT HEAD WITHOUT CONTRAST TECHNIQUE: Contiguous axial images were obtained from the base of the skull through the vertex without intravenous contrast. COMPARISON:  None. FINDINGS: Brain: No evidence of acute infarction, hemorrhage, hydrocephalus, extra-axial collection or mass lesion/mass effect. Vascular: No hyperdense vessel or unexpected calcification. Skull: Normal. Negative for fracture or focal lesion. Sinuses/Orbits: No acute finding. Other: None. IMPRESSION: Normal head CT. Electronically Signed   By: Lupita RaiderJames  Green Jr M.D.   On: 10/18/2019 12:00   Ct Cervical Spine Wo Contrast  Result Date: 10/18/2019 CLINICAL DATA:  Post fall striking back of  head. EXAM: CT CERVICAL SPINE WITHOUT CONTRAST TECHNIQUE: Multidetector CT imaging of the cervical spine was performed without intravenous contrast. Multiplanar CT image reconstructions were also generated. COMPARISON:  None. FINDINGS: Alignment: Straightening of normal lordosis. No traumatic subluxation. Skull base and vertebrae: No acute fracture. Vertebral body heights are maintained. The dens and skull base are intact. Soft tissues and spinal canal: No prevertebral fluid or swelling. No visible canal hematoma. Disc levels: Multilevel disc space narrowing and endplate spurring, most prominent at C5-C6. Scattered facet hypertrophy. Upper chest: No acute finding. Other: None. IMPRESSION: Multilevel degenerative change in the cervical spine without acute fracture or subluxation. Electronically Signed   By: Narda RutherfordMelanie  Sanford M.D.   On: 10/18/2019 18:51   Ct Lumbar Spine Wo Contrast  Result Date: 10/18/2019 CLINICAL DATA:  Pain status post fall EXAM: CT LUMBAR SPINE WITHOUT CONTRAST TECHNIQUE: Multidetector CT imaging of the lumbar spine was performed without intravenous contrast administration. Multiplanar CT image reconstructions were also generated. COMPARISON:  CT dated January 03, 2019 FINDINGS: Segmentation: 5 lumbar type vertebrae. Alignment: Normal. Vertebrae: There is no displaced fracture. No dislocation. There is unchanged grade 1 anterolisthesis of L4 on L5 measuring approximately 4 mm. This is presumably degenerative in etiology. Multilevel disc height loss is noted, greatest at the T12-L1 and L1-L2 levels. Multilevel facet arthrosis is noted, greatest at the lower lumbar segments. Paraspinal and other soft tissues: Negative. Disc levels: Multilevel disc height loss is noted as detailed above. IMPRESSION: 1. No evidence of acute fracture or dislocation of the lumbar spine. 2. Multilevel degenerative changes as detailed above. Electronically Signed   By: Katherine Mantlehristopher  Green M.D.   On: 10/18/2019 18:53    Mr Lumbar Spine W Wo Contrast  Result Date: 10/18/2019 CLINICAL DATA:  Back pain after a fall EXAM: MRI LUMBAR SPINE WITHOUT AND WITH CONTRAST TECHNIQUE: Multiplanar and multiecho pulse sequences of the lumbar spine were obtained without and with intravenous contrast. CONTRAST:  8mL GADAVIST GADOBUTROL 1 MMOL/ML IV SOLN COMPARISON:  None. FINDINGS: Segmentation: There are 5 non-rib bearing lumbar type vertebral bodies with the last intervertebral disc space labeled as L5-S1. Alignment: There is a grade 1 anterolisthesis of L4 on L5 measuring 4 mm. There is a minimal retrolisthesis of L1 on L2. Vertebrae: The vertebral body heights are well maintained. No fracture, marrow edema,or pathologic marrow infiltration. There is mildly increased STIR signal seen at the anterior endplate L1-L2. Conus medullaris and cauda equina: Conus extends to the L1 level. Conus and cauda equina appear normal. No abnormal intramedullary or leptomeningeal enhancement seen. Paraspinal and other soft tissues: There is mild edema seen within  the paraspinal subcutaneous tissues from L2 through L4. There is mildly increased signal with a small amount of fluid seen between the inter spinous ligament at L2-L3 and L3-L4. No widening of the interspinous ligament space however is seen. There is a T2 bright cystic lesion seen within the left kidney. The retroperitoneal structures are unremarkable. The sacroiliac joints are intact. Disc levels: T12-L1:  No significant canal or neural foraminal narrowing. L1-L2: There is a minimal broad-based disc bulge with facet arthrosis which causes mild bilateral neural foraminal narrowing. L2-L3: There is a broad-based disc bulge with facet arthrosis and ligamentum flavum hypertrophy which causes mild to moderate bilateral neural foraminal narrowing. There is mild effacement anterior thecal sac. L3-L4: There is a broad-based disc bulge with facet arthrosis which causes moderate bilateral neural foraminal  narrowing. There is mild effacement anterior thecal sac. L4-L5: There is a broad-based disc bulge with disc uncovering and facet arthrosis. There is moderate bilateral neural foraminal narrowing. The central thecal sac measures 6 mm in AP diameter. L5-S1: Broad-based disc bulge with facet arthrosis which causes moderate to severe left and moderate right neural foraminal narrowing. IMPRESSION: 1. Grade 1 anterolisthesis of L4 on L5 and a minimal retrolisthesis of L1 on L2. 2. Intraspinous ligamentous edema at L2-L3 and L3-L4, suggestive ligamentous sprain. Mild overlying subcutaneous edema. 3. No acute fracture or malalignment. 4. 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 Electronically Signed   By: Jonna Clark M.D.   On: 10/18/2019 22:09    Pending Labs Unresulted Labs (From admission, onward)    Start     Ordered   10/18/19 2259  SARS CORONAVIRUS 2 (TAT 6-24 HRS) Nasopharyngeal Nasopharyngeal Swab  (Asymptomatic/Tier 2 Patients Labs)  Once,   STAT    Question Answer Comment  Is this test for diagnosis or screening Screening   Symptomatic for COVID-19 as defined by CDC No   Hospitalized for COVID-19 No   Admitted to ICU for COVID-19 No   Previously tested for COVID-19 No   Resident in a congregate (group) care setting No   Employed in healthcare setting No   Pregnant No      10/18/19 2258          Vitals/Pain Today's Vitals   10/19/19 0030 10/19/19 0032 10/19/19 0224 10/19/19 0313  BP: (!) 131/56 131/71  128/70  Pulse: 67 63  (!) 55  Resp:  20  18  Temp:  98.2 F (36.8 C)  98.3 F (36.8 C)  TempSrc:  Oral  Oral  SpO2: (!) 88% 96%  98%  Weight:      Height:      PainSc:   Asleep     Isolation Precautions No active isolations  Medications Medications  acetaminophen (TYLENOL) tablet 1,000 mg (has no administration in time range)  atenolol (TENORMIN) tablet 25 mg (25 mg Oral Not  Given 10/19/19 0055)  diazepam (VALIUM) injection 2.5 mg (2.5 mg Intravenous Not Given 10/19/19 0058)  meclizine (ANTIVERT) tablet 25 mg (has no administration in time range)  pantoprazole (PROTONIX) EC tablet 40 mg (has no administration in time range)  ondansetron (ZOFRAN) tablet 4 mg (has no administration in time range)  oxybutynin (DITROPAN-XL) 24 hr tablet 10 mg (has no administration in time range)  gabapentin (NEURONTIN) capsule 300 mg (300 mg Oral Given 10/19/19 0058)  apixaban (ELIQUIS) tablet 5 mg (5 mg Oral Given 10/19/19 0107)  Calcium Carb-Cholecalciferol 600-800 MG-UNIT TABS (has no  administration in time range)  rOPINIRole (REQUIP) tablet 2 mg (2 mg Oral Not Given 10/19/19 0017)  magnesium oxide (MAG-OX) tablet 200 mg (has no administration in time range)  vitamin C (ASCORBIC ACID) tablet 500 mg (has no administration in time range)  loratadine (CLARITIN) tablet 10 mg (has no administration in time range)  0.9 %  sodium chloride infusion ( Intravenous New Bag/Given 10/19/19 0111)  ketorolac (TORADOL) 15 MG/ML injection 15 mg (has no administration in time range)  HYDROmorphone (DILAUDID) injection 1 mg (has no administration in time range)  ondansetron (ZOFRAN) injection 4 mg (has no administration in time range)  fentaNYL (SUBLIMAZE) injection 50 mcg (50 mcg Intravenous Given 10/18/19 1641)  ondansetron (ZOFRAN) injection 4 mg (4 mg Intravenous Given 10/18/19 1641)  lactated ringers bolus 1,000 mL (0 mLs Intravenous Stopped 10/18/19 1927)  rOPINIRole (REQUIP) tablet 2 mg (2 mg Oral Given 10/18/19 2255)  gadobutrol (GADAVIST) 1 MMOL/ML injection 8 mL (8 mLs Intravenous Contrast Given 10/18/19 2156)  fentaNYL (SUBLIMAZE) injection 50 mcg (50 mcg Intravenous Given 10/18/19 2202)  dexamethasone (DECADRON) injection 10 mg (10 mg Intravenous Given 10/18/19 2318)  fentaNYL (SUBLIMAZE) injection 50 mcg (50 mcg Intravenous Given 10/18/19 2318)  lactated ringers bolus 1,000 mL (0 mLs  Intravenous Stopped 10/19/19 0056)  ondansetron (ZOFRAN) injection 4 mg (4 mg Intravenous Given 10/19/19 0058)    Mobility walks with person assist Moderate fall risk   Focused Assessments   R Recommendations: See Admitting Provider Note  Report given to:   Additional Notes: -

## 2019-10-19 NOTE — Discharge Summary (Signed)
Physician Discharge Summary  [facility] ZOX:[id number] DOB: 1951/08/30 DOA: 10/18/2019  PCP: Tempie DonningMurphy, Martin, MD  Admit date: 10/18/2019 Discharge date: 10/19/2019  Time spent: 35 minutes  Recommendations for Outpatient Follow-up:  1. PCP in 1 week 2. Neurosurgery Dr. Wynetta Emeryram in 2 to 3 weeks   Discharge Diagnoses:  Principal Problem:   Intractable back pain Active Problems:   Essential hypertension   History of DVT (deep vein thrombosis)   Restless leg syndrome   Borderline diabetes   Discharge Condition: Stable  Diet recommendation: Carb modified  Filed Weights   10/18/19 2040  Weight: 97.5 kg    History of present illness:  Diana Mcdonald is a 68 y.o. female with medical history significant of prediabetes, hypertension, DVT on Eliquis, mild chronic low back pain came with a chief complaint of headache, lower back pain and nausea.  Patient states fell trying to fix a Christmas tree.  She fell on her back and hit her head. She has history of DVT and she is on Eliquis She was complaining of severe low back pain radiating down her leg, in the emergency room she underwent CT head C-spine and LS-spine which showed spinal stenosis and degenerative disc disease, MRI of LS spine noted interspinous ligamentous edema -Case discussed with neurosurgery who recommended discharge home on a Medrol Dosepak with follow-up, however patient had persistent severe pain and hence was admitted overnight for observation  Hospital course  Severe low back pain  -status post fall with interspinous ligamentous edema and soft tissue injury -As noted on MRI -Treated supportively with low-dose Decadron, Tylenol, lidocaine patch -Seen by physical therapy and was able to ambulate in the room with assistance -Discharged home in a stable condition on Medrol Dosepak, lidocaine patch and tramadol -Patient declines home health services -Advised increasing PPI to twice daily while on steroids and  also to exercise extreme caution with carb modified diet given risk of hypoglycemia while on steroids -Advised follow-up with Dr. Wynetta Emeryram in a few weeks   Rest of her chronic medical problems were stable  Discharge Exam: Vitals:   10/19/19 0817 10/19/19 1437  BP: (!) 124/59 130/64  Pulse: 81 76  Resp: 18 18  Temp: 98.1 F (36.7 C) 98.2 F (36.8 C)  SpO2: 95% 96%    General: AAOx3 Cardiovascular: S1S2/RRR Respiratory: CTAB  Discharge Instructions   Discharge Instructions    Diet - low sodium heart healthy   Complete by: As directed    Diet Carb Modified   Complete by: As directed    Increase activity slowly   Complete by: As directed      Allergies as of 10/19/2019   No Known Allergies     Medication List    TAKE these medications   acetaminophen 650 MG CR tablet Commonly known as: TYLENOL Take 1,300 mg by mouth every 8 (eight) hours as needed for pain.   atenolol 50 MG tablet Commonly known as: TENORMIN Take 25 mg by mouth at bedtime.   CALCIUM 600/VITAMIN D3 PO Take 1 tablet by mouth daily.   cetirizine 10 MG tablet Commonly known as: ZYRTEC Take 10 mg by mouth daily.   diazepam 2 MG tablet Commonly known as: VALIUM 2 mg See admin instructions. Take 2 mg sublingually as needed for vertigo, may repeat every 30 minutes until symptoms improve.  Do not exceed 4 tablets within 8 hours.   Eliquis 5 MG Tabs tablet Generic drug: apixaban Take 5 mg by mouth 2 (two) times daily.  gabapentin 300 MG capsule Commonly known as: NEURONTIN Take 300 mg by mouth 2 (two) times daily.   lidocaine 5 % Commonly known as: LIDODERM Place 1 patch onto the skin daily. Remove & Discard patch within 12 hours or as directed by MD Start taking on: October 20, 2019   Magnesium 250 MG Tabs Take 250 mg by mouth daily.   meclizine 25 MG tablet Commonly known as: ANTIVERT Take 25 mg by mouth 3 (three) times daily as needed for dizziness.   metFORMIN 500 MG 24 hr  tablet Commonly known as: GLUCOPHAGE-XR Take 500 mg by mouth daily with supper.   methylPREDNISolone 4 MG Tbpk tablet Commonly known as: MEDROL DOSEPAK Take 4mg  tab after breakfast, 4mg  after lunch and 4mg  after dinner for 2days Then 4mg  in am and then 4mg  after dinner for 2days Then 4mg  daily after breakfast for 2days  Then 2mg  (half) tab in am for 2days then STOP   omeprazole 40 MG capsule Commonly known as: PRILOSEC Take 1 capsule (40 mg total) by mouth 2 (two) times daily. Please take this twice a day while on steroid dosepak What changed:   when to take this  additional instructions   ondansetron 4 MG tablet Commonly known as: ZOFRAN Take 4 mg by mouth every 6 (six) hours as needed for nausea or vomiting.   OVER THE COUNTER MEDICATION Place 1 tablet under the tongue at bedtime as needed (restless legs). [place] Restful Legs PM   oxybutynin 10 MG 24 hr tablet Commonly known as: DITROPAN-XL Take 10 mg by mouth daily.   rOPINIRole 0.5 MG tablet Commonly known as: REQUIP Take 2 mg by mouth at bedtime.   traMADol 50 MG tablet Commonly known as: ULTRAM Take 1 tablet (50 mg total) by mouth every 6 (six) hours as needed for moderate pain.   vitamin C 500 MG tablet Commonly known as: ASCORBIC ACID Take 500 mg by mouth daily.   Vitamin D3 125 MCG (5000 UT) Caps Take 5,000 Units by mouth daily.      No Known Allergies Follow-up Information    Joanette Gula, MD. Schedule an appointment as soon as possible for a visit in 1 week(s).   Specialty: Internal Medicine Contact information: [place] [address] [phone]        Kary Kos, MD. Schedule an appointment as soon as possible for a visit in 2 week(s).   Specialty: Neurosurgery Contact information: [address] [place] [address] [phone]            The results of significant diagnostics from this hospitalization (including imaging, microbiology,  ancillary and laboratory) are listed below for reference.    Significant Diagnostic Studies: Ct Head Wo Contrast  Result Date: 10/18/2019 CLINICAL DATA:  Posttraumatic headache after fall. No loss of consciousness. EXAM: CT HEAD WITHOUT CONTRAST TECHNIQUE: Contiguous axial images were obtained from the base of the skull through the vertex without intravenous contrast. COMPARISON:  None. FINDINGS: Brain: No evidence of acute infarction, hemorrhage, hydrocephalus, extra-axial collection or mass lesion/mass effect. Vascular: No hyperdense vessel or unexpected calcification. Skull: Normal. Negative for fracture or focal lesion. Sinuses/Orbits: No acute finding. Other: None. IMPRESSION: Normal head CT. Electronically Signed   By: Marijo Conception M.D.   On: 10/18/2019 12:00   Ct Cervical Spine Wo Contrast  Result Date: 10/18/2019 CLINICAL DATA:  Post fall striking back of head. EXAM: CT CERVICAL SPINE WITHOUT CONTRAST TECHNIQUE: Multidetector CT imaging of the cervical spine  was performed without intravenous contrast. Multiplanar CT image reconstructions were also generated. COMPARISON:  None. FINDINGS: Alignment: Straightening of normal lordosis. No traumatic subluxation. Skull base and vertebrae: No acute fracture. Vertebral body heights are maintained. The dens and skull base are intact. Soft tissues and spinal canal: No prevertebral fluid or swelling. No visible canal hematoma. Disc levels: Multilevel disc space narrowing and endplate spurring, most prominent at C5-C6. Scattered facet hypertrophy. Upper chest: No acute finding. Other: None. IMPRESSION: Multilevel degenerative change in the cervical spine without acute fracture or subluxation. Electronically Signed   By: Narda Rutherford M.D.   On: 10/18/2019 18:51   Ct Lumbar Spine Wo Contrast  Result Date: 10/18/2019 CLINICAL DATA:  Pain status post fall EXAM: CT LUMBAR SPINE WITHOUT CONTRAST TECHNIQUE: Multidetector CT imaging of the lumbar spine was  performed without intravenous contrast administration. Multiplanar CT image reconstructions were also generated. COMPARISON:  CT dated January 03, 2019 FINDINGS: Segmentation: 5 lumbar type vertebrae. Alignment: Normal. Vertebrae: There is no displaced fracture. No dislocation. There is unchanged grade 1 anterolisthesis of L4 on L5 measuring approximately 4 mm. This is presumably degenerative in etiology. Multilevel disc height loss is noted, greatest at the T12-L1 and L1-L2 levels. Multilevel facet arthrosis is noted, greatest at the lower lumbar segments. Paraspinal and other soft tissues: Negative. Disc levels: Multilevel disc height loss is noted as detailed above. IMPRESSION: 1. No evidence of acute fracture or dislocation of the lumbar spine. 2. Multilevel degenerative changes as detailed above. Electronically Signed   By: Katherine Mantle M.D.   On: 10/18/2019 18:53   Mr Lumbar Spine W Wo Contrast  Result Date: 10/18/2019 CLINICAL DATA:  Back pain after a fall EXAM: MRI LUMBAR SPINE WITHOUT AND WITH CONTRAST TECHNIQUE: Multiplanar and multiecho pulse sequences of the lumbar spine were obtained without and with intravenous contrast. CONTRAST:  8mL GADAVIST GADOBUTROL 1 MMOL/ML IV SOLN COMPARISON:  None. FINDINGS: Segmentation: There are 5 non-rib bearing lumbar type vertebral bodies with the last intervertebral disc space labeled as L5-S1. Alignment: There is a grade 1 anterolisthesis of L4 on L5 measuring 4 mm. There is a minimal retrolisthesis of L1 on L2. Vertebrae: The vertebral body heights are well maintained. No fracture, marrow edema,or pathologic marrow infiltration. There is mildly increased STIR signal seen at the anterior endplate L1-L2. Conus medullaris and cauda equina: Conus extends to the L1 level. Conus and cauda equina appear normal. No abnormal intramedullary or leptomeningeal enhancement seen. Paraspinal and other soft tissues: There is mild edema seen within the paraspinal  subcutaneous tissues from L2 through L4. There is mildly increased signal with a small amount of fluid seen between the inter spinous ligament at L2-L3 and L3-L4. No widening of the interspinous ligament space however is seen. There is a T2 bright cystic lesion seen within the left kidney. The retroperitoneal structures are unremarkable. The sacroiliac joints are intact. Disc levels: T12-L1:  No significant canal or neural foraminal narrowing. L1-L2: There is a minimal broad-based disc bulge with facet arthrosis which causes mild bilateral neural foraminal narrowing. L2-L3: There is a broad-based disc bulge with facet arthrosis and ligamentum flavum hypertrophy which causes mild to moderate bilateral neural foraminal narrowing. There is mild effacement anterior thecal sac. L3-L4: There is a broad-based disc bulge with facet arthrosis which causes moderate bilateral neural foraminal narrowing. There is mild effacement anterior thecal sac. L4-L5: There is a broad-based disc bulge with disc uncovering and facet arthrosis. There is moderate bilateral neural foraminal narrowing. The central  thecal sac measures 6 mm in AP diameter. L5-S1: Broad-based disc bulge with facet arthrosis which causes moderate to severe left and moderate right neural foraminal narrowing. IMPRESSION: 1. Grade 1 anterolisthesis of L4 on L5 and a minimal retrolisthesis of L1 on L2. 2. Intraspinous ligamentous edema at L2-L3 and L3-L4, suggestive ligamentous sprain. Mild overlying subcutaneous edema. 3. No acute fracture or malalignment. 4. 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 Electronically Signed   By: Jonna Clark M.D.   On: 10/18/2019 22:09    Microbiology: Recent Results (from the past 240 hour(s))  SARS CORONAVIRUS 2 (TAT 6-24 HRS) Nasopharyngeal Nasopharyngeal Swab     Status: None   Collection Time: 10/18/19 10:59 PM   Specimen:  Nasopharyngeal Swab  Result Value Ref Range Status   SARS Coronavirus 2 NEGATIVE NEGATIVE Final    Comment: (NOTE) SARS-CoV-2 target nucleic acids are NOT DETECTED. The SARS-CoV-2 RNA is generally detectable in upper and lower respiratory specimens during the acute phase of infection. Negative results do not preclude SARS-CoV-2 infection, do not rule out co-infections with other pathogens, and should not be used as the sole basis for treatment or other patient management decisions. Negative results must be combined with clinical observations, patient history, and epidemiological information. The expected result is Negative. Fact Sheet for Patients: HairSlick.no Fact Sheet for Healthcare Providers: quierodirigir.com This test is not yet approved or cleared by the [place] FDA and  has been authorized for detection and/or diagnosis of SARS-CoV-2 by FDA under an Emergency Use Authorization (EUA). This EUA will remain  in effect (meaning this test can be used) for the duration of the COVID-19 declaration under Section 56 4(b)(1) of the Act, 21 U.S.C. section 360bbb-3(b)(1), unless the authorization is terminated or revoked sooner. Performed at [facility], [address], [place], [place] [address]      Labs: Basic Metabolic Panel: Recent Labs  Lab 10/18/19 1011  NA 138  K 4.6  CL 105  CO2 22  GLUCOSE 116*  BUN 16  CREATININE 1.20*  CALCIUM 9.3   Liver Function Tests: No results for input(s): AST, ALT, ALKPHOS, BILITOT, PROT, ALBUMIN in the last 168 hours. No results for input(s): LIPASE, AMYLASE in the last 168 hours. No results for input(s): AMMONIA in the last 168 hours. CBC: Recent Labs  Lab 10/18/19 1011  WBC 6.8  HGB 12.4  HCT 39.9  MCV 86.6  PLT 271   Cardiac Enzymes: No results for input(s): CKTOTAL, CKMB, CKMBINDEX, TROPONINI in the last 168 hours. BNP: BNP (last 3 results) No results  for input(s): BNP in the last 8760 hours.  ProBNP (last 3 results) No results for input(s): PROBNP in the last 8760 hours.  CBG: No results for input(s): GLUCAP in the last 168 hours.     Signed:  Zannie Cove MD.  [facility] 10/19/2019, 2:59 PM

## 2019-10-19 NOTE — Progress Notes (Signed)
Discharge instructions reviewed with pt and her husband.  Copy of instructions and scripts given to pt. Pt getting dressed and will call when ready to go.  Pt having some light headedness when sitting up, instructed pt and her husband to rise slowly and to remain sitting til lightheadedness subsides before standing (here and at home), both verbalized understanding and will do so.  Pt knows to rest and not to over do it at home.    At [place]   Pt d/c'd via wheelchair with belongings, with husband.             Escorted by unit staff RN.

## 2019-10-19 NOTE — Evaluation (Addendum)
Physical Therapy Evaluation Patient Details Name: Diana Mcdonald MRN: [id number] DOB: 02-25-1951 Today's Date: 10/19/2019   History of Present Illness  Pt is a 68 y/o female admitted secondary to a fall at home in which she injured her back and also hit her head. MRI of the spine revealed mild edema in the L2-L4 region, no acute fractures or abnormalities. PMH including but not limited to HTN, hx of DVT on Eliquis.    Clinical Impression  Pt presented supine in bed with HOB elevated, awake and willing to participate in therapy session. Prior to admission, pt reported that she was independent with all functional mobility and ADLs. Pt lives with her husband in a two level home (able to live on the main level) with several steps to enter the house. At the time of evaluation, pt limited secondary to worsening back and headache pain with mobility. Pt able to perform bed mobility with supervision, transfers with min guard and ambulate a short distance within her room with min guard for safety. Pt overall very slow, guarded and antalgic with movement. Refusing to try using an AD. PT provided pt education re: back precautions, safety with mobility and concussion/post-concussion symptoms. PT will continue to follow pt acutely to progress mobility as tolerated.     Follow Up Recommendations Supervision/Assistance - 24 hour;Other (comment)(initially until pt progressing with mobility)    Equipment Recommendations  Other (comment)(would recommend RW but pt refusing)    Recommendations for Other Services       Precautions / Restrictions Precautions Precautions: Fall;Back Precaution Comments: reviewed back precautions with pt, log roll technique for comfort Restrictions Weight Bearing Restrictions: No      Mobility  Bed Mobility Overal bed mobility: Needs Assistance Bed Mobility: Rolling;Sidelying to Sit Rolling: Supervision Sidelying to sit: Supervision       General bed mobility  comments: cueing for log roll technique, supervision for safety  Transfers Overall transfer level: Needs assistance Equipment used: None Transfers: Sit to/from Stand Sit to Stand: Min guard         General transfer comment: pt performed x1 from EOB and x1 from toilet in bathroom; min guard for safety with slow, antalgic transitional movement  Ambulation/Gait Ambulation/Gait assistance: Min guard Gait Distance (Feet): 15 Feet(15' x2 to bathroom and back) Assistive device: 1 person hand held assist;None Gait Pattern/deviations: Step-to pattern;Step-through pattern;Decreased step length - right;Decreased step length - left;Decreased stride length;Antalgic Gait velocity: decreased   General Gait Details: pt with very slow, antalgic gait pattern with use of 1HHA or reaching for support surfaces  Stairs            Wheelchair Mobility    Modified Rankin (Stroke Patients Only)       Balance Overall balance assessment: Needs assistance Sitting-balance support: Feet supported Sitting balance-Leahy Scale: Fair     Standing balance support: During functional activity;No upper extremity supported Standing balance-Leahy Scale: Fair                               Pertinent Vitals/Pain Pain Assessment: Faces Faces Pain Scale: Hurts whole lot Pain Location: L lower back and buttock Pain Descriptors / Indicators: Aching;Headache;Sore Pain Intervention(s): Monitored during session;Repositioned;Patient requesting pain meds-RN notified    Home Living Family/patient expects to be discharged to:: Private residence Living Arrangements: Spouse/significant other Available Help at Discharge: Family;Available 24 hours/day Type of Home: House Home Access: Stairs to enter Entrance Stairs-Rails: Right Entrance Stairs-Number of  Steps: 5 Home Layout: Able to live on main level with bedroom/bathroom Home Equipment: Gil[place] Mor - single point      Prior Function Level of  Independence: Independent               Hand Dominance        Extremity/Trunk Assessment   Upper Extremity Assessment Upper Extremity Assessment: Overall WFL for tasks assessed    Lower Extremity Assessment Lower Extremity Assessment: Overall WFL for tasks assessed    Cervical / Trunk Assessment Cervical / Trunk Assessment: Other exceptions Cervical / Trunk Exceptions: acute back injury with previous back sx  Communication   Communication: No difficulties  Cognition Arousal/Alertness: Awake/alert Behavior During Therapy: WFL for tasks assessed/performed Overall Cognitive Status: Within Functional Limits for tasks assessed                                        General Comments      Exercises     Assessment/Plan    PT Assessment Patient needs continued PT services  PT Problem List Decreased balance;Decreased mobility;Decreased coordination;Decreased safety awareness;Decreased knowledge of use of DME;Decreased knowledge of precautions;Pain       PT Treatment Interventions DME instruction;Gait training;Stair training;Functional mobility training;Therapeutic activities;Therapeutic exercise;Neuromuscular re-education;Balance training;Patient/family education    PT Goals (Current goals can be found in the Care Plan section)  Acute Rehab PT Goals Patient Stated Goal: decrease pain; clean out her mother's attic this weekend PT Goal Formulation: With patient Time For Goal Achievement: 11/02/19 Potential to Achieve Goals: Good    Frequency Min 3X/week   Barriers to discharge        Co-evaluation               AM-PAC PT "6 Clicks" Mobility  Outcome Measure Help needed turning from your back to your side while in a flat bed without using bedrails?: None Help needed moving from lying on your back to sitting on the side of a flat bed without using bedrails?: None Help needed moving to and from a bed to a chair (including a wheelchair)?:  None Help needed standing up from a chair using your arms (e.g., wheelchair or bedside chair)?: None Help needed to walk in hospital room?: A Little Help needed climbing 3-5 steps with a railing? : A Little 6 Click Score: 22    End of Session Equipment Utilized During Treatment: Gait belt Activity Tolerance: Patient tolerated treatment well Patient left: in chair;with call bell/phone within reach Nurse Communication: Mobility status;Patient requests pain meds PT Visit Diagnosis: Other abnormalities of gait and mobility (R26.89);Pain Pain - Right/Left: Left Pain - part of body: (L lower back and buttock)    Time: 3762-8315 PT Time Calculation (min) (ACUTE ONLY): 32 min   Charges:   PT Evaluation $PT Eval Moderate Complexity: 1 Mod PT Treatments $Gait Training: 8-22 mins        Arletta Bale, DPT  Acute Rehabilitation Services Pager [phone] Office [phone]    Alessandra Bevels Develle Sievers 10/19/2019, 10:32 AM

## 2019-11-20 HISTORY — PX: OTHER SURGICAL HISTORY: SHX169

## 2020-03-15 ENCOUNTER — Other Ambulatory Visit: Payer: Self-pay | Admitting: Neurosurgery

## 2020-04-19 NOTE — Progress Notes (Signed)
[facility] - [place], [place] - [address] [place] [address] Phone: [phone] Fax: [phone]      Your procedure is scheduled on Apr 24, 2020.  Report to [facility] Main Entrance "A" at 6:30 A.M., and check in at the Admitting office.  Call this number if you have problems the morning of surgery:  [phone]  Call [phone] if you have any questions prior to your surgery date Monday-Friday 8am-4pm    Remember:  Do not eat or drink after midnight the night before your surgery    Take these medicines the morning of surgery with A SIP OF WATER: cetirizine (ZYRTEC) gabapentin (NEURONTIN) omeprazole (PRILOSEC)  As needed: acetaminophen (TYLENOL) diazepam (VALIUM) meclizine (ANTIVERT)  ondansetron (ZOFRAN) traMADol (ULTRAM)  As of today, STOP taking any Aspirin (unless otherwise instructed by your surgeon) and Aspirin containing products, Aleve, Naproxen, Ibuprofen, Motrin, Advil, Goody's, BC's, all herbal medications, fish oil, and all vitami  WHAT DO I DO ABOUT MY DIABETES MEDICATION?   Marland Kitchen Do not take oral diabetes medicines (pills) the morning of surgery.  Do Not take Metformin day of surgery.  HOW TO MANAGE YOUR DIABETES BEFORE AND AFTER SURGERY  Why is it important to control my blood sugar before and after surgery? . Improving blood sugar levels before and after surgery helps healing and can limit problems. . A way of improving blood sugar control is eating a healthy diet by: o  Eating less sugar and carbohydrates o  Increasing activity/exercise o  Talking with your doctor about reaching your blood sugar goals . High blood sugars (greater than 180 mg/dL) can raise your risk of infections and slow your recovery, so you will need to focus on controlling your diabetes during the weeks before surgery. . Make sure that the doctor who takes care of your diabetes knows about your planned surgery including the date and  location.  How do I manage my blood sugar before surgery? . Check your blood sugar at least 4 times a day, starting 2 days before surgery, to make sure that the level is not too high or low. . Check your blood sugar the morning of your surgery when you wake up and every 2 hours until you get to the Short Stay unit. o If your blood sugar is less than 70 mg/dL, you will need to treat for low blood sugar: - Do not take insulin. - Treat a low blood sugar (less than 70 mg/dL) with  cup of clear juice (cranberry or apple), 4 glucose tablets, OR glucose gel. - Recheck blood sugar in 15 minutes after treatment (to make sure it is greater than 70 mg/dL). If your blood sugar is not greater than 70 mg/dL on recheck, call [phone] for further instructions. . Report your blood sugar to the short stay nurse when you get to Short Stay.  . If you are admitted to the hospital after surgery: o Your blood sugar will be checked by the staff and you will probably be given insulin after surgery (instead of oral diabetes medicines) to make sure you have good blood sugar levels. o The goal for blood sugar control after surgery is 80-180 mg/dL.                    Do not wear jewelry, make up, or nail polish            Do not wear lotions, powders, perfumes or deodorant.  Do not shave 48 hours prior to surgery.              Do not bring valuables to the hospital.            [facility] is not responsible for any belongings or valuables.  Do NOT Smoke (Tobacco/Vapping) or drink Alcohol 24 hours prior to your procedure If you use a CPAP at night, you may bring all equipment for your overnight stay.   Contacts, glasses, dentures or bridgework may not be worn into surgery.      For patients admitted to the hospital, discharge time will be determined by your treatment team.   Patients discharged the day of surgery will not be allowed to drive home, and someone needs to stay with them for 24  hours.    Special instructions:   [facility]- Preparing For Surgery  Before surgery, you can play an important role. Because skin is not sterile, your skin needs to be as free of germs as possible. You can reduce the number of germs on your skin by washing with CHG (chlorahexidine gluconate) Soap before surgery.  CHG is an antiseptic cleaner which kills germs and bonds with the skin to continue killing germs even after washing.    Oral Hygiene is also important to reduce your risk of infection.  Remember - BRUSH YOUR TEETH THE MORNING OF SURGERY WITH YOUR REGULAR TOOTHPASTE  Please do not use if you have an allergy to CHG or antibacterial soaps. If your skin becomes reddened/irritated stop using the CHG.  Do not shave (including legs and underarms) for at least 48 hours prior to first CHG shower. It is OK to shave your face.  Please follow these instructions carefully.   1. Shower the NIGHT BEFORE SURGERY and the MORNING OF SURGERY with CHG Soap.   2. If you chose to wash your hair, wash your hair first as usual with your normal shampoo.  3. After you shampoo, rinse your hair and body thoroughly to remove the shampoo.  4. Use CHG as you would any other liquid soap. You can apply CHG directly to the skin and wash gently with a scrungie or a clean washcloth.   5. Apply the CHG Soap to your body ONLY FROM THE NECK DOWN.  Do not use on open wounds or open sores. Avoid contact with your eyes, ears, mouth and genitals (private parts). Wash Face and genitals (private parts)  with your normal soap.   6. Wash thoroughly, paying special attention to the area where your surgery will be performed.  7. Thoroughly rinse your body with warm water from the neck down.  8. DO NOT shower/wash with your normal soap after using and rinsing off the CHG Soap.  9. Pat yourself dry with a CLEAN TOWEL.  10. Wear CLEAN PAJAMAS to bed the night before surgery, wear comfortable clothes the morning of  surgery  11. Place CLEAN SHEETS on your bed the night of your first shower and DO NOT SLEEP WITH PETS.   Day of Surgery:   Do not apply any deodorants/lotions.  Please wear clean clothes to the hospital/surgery center.   Remember to brush your teeth WITH YOUR REGULAR TOOTHPASTE.   Please read over the following fact sheets that you were given.

## 2020-04-22 ENCOUNTER — Encounter (HOSPITAL_COMMUNITY): Payer: Self-pay

## 2020-04-22 ENCOUNTER — Other Ambulatory Visit: Payer: Self-pay

## 2020-04-22 ENCOUNTER — Other Ambulatory Visit (HOSPITAL_COMMUNITY)
Admission: RE | Admit: 2020-04-22 | Discharge: 2020-04-22 | Disposition: A | Discharge status: Home / Self Care | Payer: Medicare HMO | Source: Ambulatory Visit | Attending: Neurosurgery | Admitting: Neurosurgery

## 2020-04-22 ENCOUNTER — Encounter (HOSPITAL_COMMUNITY)
Admission: RE | Admit: 2020-04-22 | Discharge: 2020-04-22 | Disposition: A | Discharge status: Home / Self Care | Payer: Medicare HMO | Source: Ambulatory Visit | Attending: Neurosurgery | Admitting: Neurosurgery

## 2020-04-22 DIAGNOSIS — Z20822 Contact with and (suspected) exposure to covid-19: Secondary | ICD-10-CM | POA: Diagnosis not present

## 2020-04-22 DIAGNOSIS — Z01812 Encounter for preprocedural laboratory examination: Secondary | ICD-10-CM | POA: Diagnosis present

## 2020-04-22 HISTORY — DX: Nausea with vomiting, unspecified: Z98.890

## 2020-04-22 HISTORY — DX: Prediabetes: R73.03

## 2020-04-22 HISTORY — DX: Other specified postprocedural states: R11.2

## 2020-04-22 LAB — BASIC METABOLIC PANEL
Anion gap: 8 (ref 5–15)
BUN: 20 mg/dL (ref 8–23)
CO2: 29 mmol/L (ref 22–32)
Calcium: 9.4 mg/dL (ref 8.9–10.3)
Chloride: 102 mmol/L (ref 98–111)
Creatinine, Ser: 1.18 mg/dL — ABNORMAL HIGH (ref 0.44–1.00)
GFR calc Af Amer: 54 mL/min — ABNORMAL LOW (ref >60)
GFR calc non Af Amer: 47 mL/min — ABNORMAL LOW (ref >60)
Glucose, Bld: 100 mg/dL — ABNORMAL HIGH (ref 70–99)
Potassium: 4.7 mmol/L (ref 3.5–5.1)
Sodium: 139 mmol/L (ref 135–145)

## 2020-04-22 LAB — CBC
HCT: 41.7 % (ref 36.0–46.0)
Hemoglobin: 12.7 g/dL (ref 12.0–15.0)
MCH: 26.5 pg (ref 26.0–34.0)
MCHC: 30.5 g/dL (ref 30.0–36.0)
MCV: 87.1 fL (ref 80.0–100.0)
Platelets: 286 10*3/uL (ref 150–400)
RBC: 4.79 MIL/uL (ref 3.87–5.11)
RDW: 14.6 % (ref 11.5–15.5)
WBC: 10.4 10*3/uL (ref 4.0–10.5)
nRBC: 0 % (ref 0.0–0.2)

## 2020-04-22 LAB — TYPE AND SCREEN
ABO/RH(D): A NEG
Antibody Screen: NEGATIVE

## 2020-04-22 LAB — SURGICAL PCR SCREEN
MRSA, PCR: NEGATIVE
Staphylococcus aureus: NEGATIVE

## 2020-04-22 LAB — GLUCOSE, CAPILLARY: Glucose-Capillary: 96 mg/dL (ref 70–99)

## 2020-04-22 LAB — ABO/RH: ABO/RH(D): A NEG

## 2020-04-22 NOTE — Progress Notes (Signed)
PCP Eulah Pont, MD - pt sees MD every 6 months for pre-diabetic monitoring - pt has not been instructed to check CBGs at home Cardiologist - Willeen Cass, MD - [facility] [place]) - pt sees MD annually for check ups  PPM/ICD - n/a   Chest x-ray - 01/14/18 EKG - 12/27/19 - in care-everywhere (documents requested from [facility]) Stress Test - pt states she has from [facility] where they were figuring out the cause of her SVT ECHO - 12/28/18 Cardiac Cath - pt says she had a cardiac cath done [facility] where they were figuring out the cause of her SVT but tests were negative and no stents were placed. (documents requested from [facility] - Dr. Willeen Cass)  Sleep Study - yes CPAP - no  Fasting Blood Sugar - pt does not know Checks Blood Sugar - does not check CBG - MD has not yet instructed pt to do so  Blood Thinner Instructions:n/a Aspirin Instructions:n/a  ERAS Protcol - n/a PRE-SURGERY Ensure or G2- n/a  COVID TEST- 04/22/20  Coronavirus Screening  Have you experienced the following symptoms:  Cough yes/no: No Fever (>100.26F)  yes/no: No Runny nose yes/no: No Sore throat yes/no: No Difficulty breathing/shortness of breath  yes/no: No  Have you or a family member traveled in the last 14 days and where? yes/no: No   If the patient indicates "YES" to the above questions, their PAT will be rescheduled to limit the exposure to others and, the surgeon will be notified. THE PATIENT WILL NEED TO BE ASYMPTOMATIC FOR 14 DAYS.   If the patient is not experiencing any of these symptoms, the PAT nurse will instruct them to NOT bring anyone with them to their appointment since they may have these symptoms or traveled as well.   Please remind your patients and families that hospital visitation restrictions are in effect and the importance of the restrictions.     Anesthesia review: yes- documents requested   Pt goes to [facility] in [place] and sees Dr. Willeen Cass. Says shes had  a stress test and echo done within the last 5 years because she was having SVT. She stated she had a cath done but no stents were placed.   She also had a recent lateral meniscus repair in 11/2019 (added in pt med hx).   Pt also stated in PAT that she's had a laminotomy, foraminotomy, and decompression on lumbar 4-lumbar 5 done by Dr. Emeline General at [facility]. 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 Documents requested.   Patient denies shortness of breath, fever, cough and chest pain at PAT appointment   All instructions explained to the patient, with a verbal understanding of the material. Patient agrees to go over the instructions while at home for a better understanding. Patient also instructed to self quarantine after being tested for COVID-19. The opportunity to ask questions was provided.

## 2020-04-23 ENCOUNTER — Emergency Department (HOSPITAL_COMMUNITY)
Admission: EM | Admit: 2020-04-23 | Discharge: 2020-04-23 | Disposition: A | Discharge status: Home / Self Care | Payer: Medicare HMO | Attending: Emergency Medicine | Admitting: Emergency Medicine

## 2020-04-23 ENCOUNTER — Other Ambulatory Visit: Payer: Self-pay

## 2020-04-23 ENCOUNTER — Encounter (HOSPITAL_COMMUNITY): Payer: Self-pay | Admitting: Emergency Medicine

## 2020-04-23 ENCOUNTER — Emergency Department (HOSPITAL_COMMUNITY): Payer: Medicare HMO

## 2020-04-23 DIAGNOSIS — Z79899 Other long term (current) drug therapy: Secondary | ICD-10-CM | POA: Diagnosis not present

## 2020-04-23 DIAGNOSIS — Y999 Unspecified external cause status: Secondary | ICD-10-CM | POA: Diagnosis not present

## 2020-04-23 DIAGNOSIS — X58XXXA Exposure to other specified factors, initial encounter: Secondary | ICD-10-CM | POA: Diagnosis not present

## 2020-04-23 DIAGNOSIS — S4991XA Unspecified injury of right shoulder and upper arm, initial encounter: Secondary | ICD-10-CM | POA: Diagnosis present

## 2020-04-23 DIAGNOSIS — S46811A Strain of other muscles, fascia and tendons at shoulder and upper arm level, right arm, initial encounter: Secondary | ICD-10-CM

## 2020-04-23 DIAGNOSIS — Y929 Unspecified place or not applicable: Secondary | ICD-10-CM | POA: Diagnosis not present

## 2020-04-23 DIAGNOSIS — I10 Essential (primary) hypertension: Secondary | ICD-10-CM | POA: Insufficient documentation

## 2020-04-23 DIAGNOSIS — Y939 Activity, unspecified: Secondary | ICD-10-CM | POA: Diagnosis not present

## 2020-04-23 DIAGNOSIS — Z7982 Long term (current) use of aspirin: Secondary | ICD-10-CM | POA: Insufficient documentation

## 2020-04-23 LAB — CBC
HCT: 41.2 % (ref 36.0–46.0)
Hemoglobin: 12.6 g/dL (ref 12.0–15.0)
MCH: 26.9 pg (ref 26.0–34.0)
MCHC: 30.6 g/dL (ref 30.0–36.0)
MCV: 87.8 fL (ref 80.0–100.0)
Platelets: 294 10*3/uL (ref 150–400)
RBC: 4.69 MIL/uL (ref 3.87–5.11)
RDW: 14.6 % (ref 11.5–15.5)
WBC: 11.2 10*3/uL — ABNORMAL HIGH (ref 4.0–10.5)
nRBC: 0 % (ref 0.0–0.2)

## 2020-04-23 LAB — BASIC METABOLIC PANEL
Anion gap: 11 (ref 5–15)
BUN: 20 mg/dL (ref 8–23)
CO2: 27 mmol/L (ref 22–32)
Calcium: 9.3 mg/dL (ref 8.9–10.3)
Chloride: 102 mmol/L (ref 98–111)
Creatinine, Ser: 1.34 mg/dL — ABNORMAL HIGH (ref 0.44–1.00)
GFR calc Af Amer: 47 mL/min — ABNORMAL LOW (ref >60)
GFR calc non Af Amer: 40 mL/min — ABNORMAL LOW (ref >60)
Glucose, Bld: 120 mg/dL — ABNORMAL HIGH (ref 70–99)
Potassium: 4.1 mmol/L (ref 3.5–5.1)
Sodium: 140 mmol/L (ref 135–145)

## 2020-04-23 LAB — SARS CORONAVIRUS 2 (TAT 6-24 HRS): SARS Coronavirus 2: NEGATIVE

## 2020-04-23 LAB — HEMOGLOBIN A1C
Hgb A1c MFr Bld: 6.2 % — ABNORMAL HIGH (ref 4.8–5.6)
Mean Plasma Glucose: 131 mg/dL

## 2020-04-23 LAB — TROPONIN I (HIGH SENSITIVITY): Troponin I (High Sensitivity): 3 ng/L (ref <18)

## 2020-04-23 MED ORDER — PREDNISONE 20 MG PO TABS: 60.0000 mg | ORAL_TABLET | Freq: Once | ORAL | Status: DC

## 2020-04-23 MED ORDER — OXYCODONE-ACETAMINOPHEN 5-325 MG PO TABS
1.0000 | ORAL_TABLET | Freq: Once | ORAL | Status: AC
  Administered 2020-04-23: 1 via ORAL
  Filled 2020-04-23: qty 1

## 2020-04-23 MED ORDER — PREDNISONE 10 MG PO TABS: 60.0000 mg | ORAL_TABLET | Freq: Every day | ORAL | 0 refills | Status: AC

## 2020-04-23 MED ORDER — OXYCODONE-ACETAMINOPHEN 5-325 MG PO TABS: 1.0000 | ORAL_TABLET | ORAL | 0 refills | Status: DC | PRN

## 2020-04-23 MED ORDER — METHOCARBAMOL 500 MG PO TABS: 500.0000 mg | ORAL_TABLET | Freq: Two times a day (BID) | ORAL | 0 refills | Status: AC

## 2020-04-23 NOTE — ED Provider Notes (Signed)
[facility] EMERGENCY [facility]PARTMENT Provider Note   CSN: [id number] Arrival date & time: 04/23/20  1109     History Chief Complaint  Patient presents with  . Arm Pain    Diana Mcdonald is a 69 y.o. female.  HPI  Patient is a 69 year old female with a past medical history significant for pneumonia, prediabetes, spinal stenosis followed by Dr. Donalee CitrinGary Cram of neurosurgery.   Patient is scheduled for lumbar surgery tomorrow.  She is presented today with right arm pain that is radicular in nature that began 2 days ago.  She states it goes down the right side of her head and begins in her back.  She states that she has no pain with moving her spine bending/twisting.  However she states that she has severe pain with moving her arm at times.  She denies any exertional pain.  She states that it feels numb and tingling and burning and stabbing in different parts of her arm.  Patient denies any weakness or true numbness.  She denies any difficulty picking things up or moving.  She denies any trauma, car accident, straining overuse that incident.  She denies any personal history of cancer, bowel or bladder incontinence, saddle anesthesia, fevers or neck stiffness.     Past Medical History:  Diagnosis Date  . DVT (deep venous thrombosis) (HCC)   . Pneumonia 2010   [facility] - bacterian pneumonia - per pt  . PONV (postoperative nausea and vomiting)   . Pre-diabetes    per pt - told by Eulah PontMurphy, MD she was pre diabetic and sees provider every 6 months for management  . Spinal stenosis     Patient Active Problem List   Diagnosis Date Noted  . Borderline diabetes 10/19/2019  . Intractable back pain 10/18/2019  . Essential hypertension 10/18/2019  . History of DVT (deep vein thrombosis) 10/18/2019  . Restless leg syndrome 10/18/2019    Past Surgical History:  Procedure Laterality Date  . ABDOMINAL HYSTERECTOMY    . CARDIAC CATHETERIZATION      within last 10 years (~2015 per pt) negative result - no stents placed  . LAMINOTOMY  02/2016   laminotomy, foraminotomy, and decompression L4-L5   . lateral menisus repair Right 11/20/2019   [facility] - Dr. Thamas JaegersLennon - per pt  . TONSILLECTOMY       OB History   No obstetric history on file.     No family history on file.  Social History   Tobacco Use  . Smoking status: Never Smoker  . Smokeless tobacco: Never Used  Substance Use Topics  . Alcohol use: Yes    Comment: rare  . Drug use: Never    Home Medications Prior to Admission medications   Medication Sig Start Date End Date Taking? Authorizing Provider  acetaminophen (TYLENOL) 650 MG CR tablet Take 650 mg by mouth every 8 (eight) hours as needed for pain.    Yes [provider]  aspirin EC 81 MG tablet Take 81 mg by mouth daily.   Yes [provider]  atenolol (TENORMIN) 25 MG tablet Take 25 mg by mouth at bedtime.    Yes [provider]  Calcium Carbonate-Vit D-Min (CALCIUM 1200 PO) Take 1,200 tablets by mouth daily.   Yes [provider]  cetirizine (ZYRTEC) 10 MG tablet Take 10 mg by mouth daily.   Yes [provider]  Cholecalciferol (VITAMIN D3) 125 MCG (5000 UT) CAPS  Take 5,000 Units by mouth daily.   Yes [provider]  diazepam (VALIUM) 2 MG tablet Take 2 mg by mouth See admin instructions. Take 2 mg sublingually as needed for vertigo, may repeat every 30 minutes until symptoms improve.  Do not exceed 4 tablets within 8 hours.    Yes [provider]  gabapentin (NEURONTIN) 300 MG capsule Take 300 mg by mouth 2 (two) times daily.    Yes [provider]  hydrochlorothiazide (HYDRODIURIL) 25 MG tablet Take 25 mg by mouth daily. 04/01/20  Yes [provider]  Magnesium 250 MG TABS Take 250 mg by mouth daily.   Yes [provider]  meclizine (ANTIVERT) 25 MG tablet Take 25 mg by mouth 3 (three) times daily as needed  for dizziness.   Yes [provider]  metFORMIN (GLUCOPHAGE-XR) 500 MG 24 hr tablet Take 500 mg by mouth daily with supper.   Yes [provider]  nystatin cream (MYCOSTATIN) Apply 1 application topically in the morning and at bedtime.  04/12/20  Yes [provider]  omeprazole (PRILOSEC) 40 MG capsule Take 1 capsule (40 mg total) by mouth 2 (two) times daily. Please take this twice a day while on steroid dosepak Patient taking differently: Take 40 mg by mouth 2 (two) times daily.  10/19/19  Yes Domenic Polite, MD  ondansetron (ZOFRAN) 4 MG tablet Take 4 mg by mouth every 6 (six) hours as needed for nausea or vomiting.   Yes [provider]  rOPINIRole (REQUIP) 0.5 MG tablet Take 1.5-2 mg by mouth every evening.    Yes [provider]  rosuvastatin (CRESTOR) 10 MG tablet Take 10 mg by mouth at bedtime. 04/01/20  Yes [provider]  tiZANidine (ZANAFLEX) 4 MG tablet Take 4 mg by mouth every 6 (six) hours as needed for muscle spasms.   Yes [provider]  traMADol (ULTRAM) 50 MG tablet Take 1 tablet (50 mg total) by mouth every 6 (six) hours as needed for moderate pain. 10/19/19  Yes Domenic Polite, MD  vitamin C (ASCORBIC ACID) 500 MG tablet Take 500 mg by mouth daily.   Yes [provider]  lidocaine (LIDODERM) 5 % Place 1 patch onto the skin daily. Remove & Discard patch within 12 hours or as directed by MD Patient not taking: Reported on 04/11/2020 10/20/19   Domenic Polite, MD  methocarbamol (ROBAXIN) 500 MG tablet Take 1 tablet (500 mg total) by mouth 2 (two) times daily. 04/23/20   Tedd Sias, PA  methylPREDNISolone (MEDROL DOSEPAK) 4 MG TBPK tablet Take 4mg  tab after breakfast, 4mg  after lunch and 4mg  after dinner for 2days Then 4mg  in am and then 4mg  after dinner for 2days Then 4mg  daily after breakfast for 2days  Then 2mg  (half) tab in am for 2days then STOP Patient not taking: Reported on 04/11/2020 10/19/19    Domenic Polite, MD  oxyCODONE-acetaminophen (PERCOCET/ROXICET) 5-325 MG tablet Take 1-2 tablets by mouth every 4 (four) hours as needed for up to 14 days for severe pain. 04/23/20 05/07/20  Tedd Sias, PA  predniSONE (DELTASONE) 10 MG tablet Take 6 tablets (60 mg total) by mouth daily for 5 days. 04/23/20 04/28/20  Tedd Sias, PA    Allergies    Patient has no known allergies.  Review of Systems   Review of Systems  Constitutional: Negative for fever.  HENT: Negative for congestion.   Respiratory: Negative for shortness of breath.   Cardiovascular: Negative for chest pain.  Gastrointestinal: Negative for abdominal distention.  Musculoskeletal: Positive for back pain.       Right arm pain   Neurological: Negative for dizziness and headaches.    Physical Exam Updated Vital Signs BP 132/68 (BP Location: Right Arm)   Pulse 62   Temp 98.7 F (37.1 C) (Oral)   Resp 16   SpO2 97%   Physical Exam Vitals and nursing note reviewed.  Constitutional:      General: She is in acute distress.  HENT:     Head: Normocephalic and atraumatic.     Nose: Nose normal.     Mouth/Throat:     Mouth: Mucous membranes are moist.  Eyes:     General: No scleral icterus. Cardiovascular:     Rate and Rhythm: Normal rate and regular rhythm.     Pulses: Normal pulses.     Heart sounds: Normal heart sounds.  Pulmonary:     Effort: Pulmonary effort is normal. No respiratory distress.     Breath sounds: No wheezing.  Abdominal:     Palpations: Abdomen is soft.     Tenderness: There is no abdominal tenderness.  Musculoskeletal:     Cervical back: Normal range of motion.     Right lower leg: No edema.     Left lower leg: No edema.     Comments: Patient with reproducible right trapezius tenderness to palpation.  No significantly in the right scapular region.  She has multiple trigger points. full range of motion of bilateral upper extremities.  She has strength intact bilaterally 5/5.  No  midline C or T spine tenderness to palpation  Skin:    General: Skin is warm and dry.     Capillary Refill: Capillary refill takes less than 2 seconds.  Neurological:     Mental Status: She is alert. Mental status is at baseline.     Comments: No sensation abnormalities on exam.  Has good motor function of the radial medial and ulnar nerve distribution motor  Psychiatric:        Mood and Affect: Mood normal.        Behavior: Behavior normal.     ED Results / Procedures / Treatments   Labs (all labs ordered are listed, but only abnormal results are displayed) Labs Reviewed  BASIC METABOLIC PANEL - Abnormal; Notable for the following components:      Result Value   Glucose, Bld 120 (*)    Creatinine, Ser 1.34 (*)    GFR calc non Af Amer 40 (*)    GFR calc Af Amer 47 (*)    All other components within normal limits  CBC - Abnormal; Notable for the following components:   WBC 11.2 (*)    All other components within normal limits  TROPONIN I (HIGH SENSITIVITY)    EKG EKG Interpretation  Date/Time:  Tuesday Apr 23 2020 11:31:49 EDT Ventricular Rate:  62 PR Interval:  152 QRS Duration: 86 QT Interval:  396 QTC Calculation: 401 R Axis:   -24 Text Interpretation: Normal sinus rhythm Low voltage QRS Cannot rule out Anterior infarct , age undetermined Abnormal ECG No old tracing to compare Confirmed by Meridee Score [phone]) on 04/23/2020 4:08:49 PM   Radiology DG Chest 2 View  Result Date: 04/23/2020 CLINICAL DATA:  Right upper back pain. EXAM: CHEST - 2 VIEW COMPARISON:  None. FINDINGS: The heart size and mediastinal contours are within normal limits. Both lungs are clear. No pneumothorax or pleural effusion is noted. The visualized  skeletal structures are unremarkable. IMPRESSION: No active cardiopulmonary disease. Electronically Signed   By: Lupita Raider M.D.   On: 04/23/2020 11:54    Procedures Procedures (including critical care time)  Medications Ordered in  ED Medications  oxyCODONE-acetaminophen (PERCOCET/ROXICET) 5-325 MG per tablet 1 tablet (1 tablet Oral Given 04/23/20 1748)    ED Course  I have reviewed the triage vital signs and the nursing notes.  Pertinent labs & imaging results that were available during my care of the patient were reviewed by me and considered in my medical decision making (see chart for details).  Patient c/c of right-sided back pain in my examination is reproducible with trapezius palpation.  She has no midline tenderness and has full range of motion.  Tacked strength and no neurologic dysfunction.  Broad differential for back pain considered includes malignancy, disc herniation, spinal epidural abscess, spinal fracture, cauda equina, pyelonephritis, kidney stone, AAA, AD, pancreatitis, PE and PTX.   History without symptoms of urinary or stool retention or incontinence, neurologic changes such as sensation change or weakness lower extremities, coagulopathy or blood thinner use, is not elderly or with history of osteoporosis, denies any history of cancer, fever, IV drug use, weight changes (unexplained), or prolonged steroid use.   Physical exam most consistent with muscular strain. Doubt cauda equina or disc herniation d/t lack of saddle anesthesia/bowel or bladder incontinence or urinary retention, normal gait and reassuring physical examination without neurologic deficits.   History is not supportive of kidney stone, AAA, AD, pancreatitis, PE or PTX. Patient has no CVA tenderness or urinary sx to suggest pyelonephritis or kidney stone.   Will manage patient conservatively at this time. NSAIDs, back exercises/stretches, heat therapy and follow up with PCP if symptoms do not resolve in 3-4 weeks. Patient offered muscle relaxer for comfort at night. Counseled on need to return to ED for fever, worsening or concerning symptoms. Patient agreeable to plan and states understanding of follow up plans and return precautions.     Vitals WNL at time of discharge and patient has mild pain improvement   Clinical Course as of Apr 23 2146  Tue Apr 23, 2020  1658 BMP without any acute abnormality.  Creatinine is at baseline she has previous records that show 1.2 to be her baseline.  Basic metabolic panel(!) [WF]  1659 CBC with very mild leukocytosis.  Doubt infectious process.   [WF]  1700 Initial troponin within normal limits.  I discontinued repeat troponin as patient is having no chest pain or shortness of breath and her arm pain is reproducible with palpation.  Low suspicion for ACS or cardiac issue.   [WF]  8577 69 year old female here with radicular right arm pain that started a few days ago.  Goes down the right side into her right fifth finger.  She supposed to be getting lumbar fusion tomorrow with Dr. Wynetta Emery.  Chest x-ray not showing any acute findings.  Will treat with some pain medicine and message Dr. Wynetta Emery to let them know itself.   [MB]    Clinical Course User Index [MB] Terrilee Files, MD [WF] Gailen Shelter, [place]   MDM Rules/Calculators/A&P                      Patient discharged with Robaxin which she will take twice daily and Percocet.  She states that she would like to have prednisone to be discharged with.  I printed her prescription and provided to her at discharge.  I recommended that she not use it until she follow-up with Dr. Wynetta Emery who she is scheduled to see for her surgery tomorrow.  Final Clinical Impression(s) / ED Diagnoses Final diagnoses:  Strain of right trapezius muscle, initial encounter    Rx / DC Orders ED Discharge Orders         Ordered    predniSONE (DELTASONE) 10 MG tablet  Daily     04/23/20 1806    methocarbamol (ROBAXIN) 500 MG tablet  2 times daily     04/23/20 1806    oxyCODONE-acetaminophen (PERCOCET/ROXICET) 5-325 MG tablet  Every 4 hours PRN     04/23/20 1806           Gailen Shelter, [place] 04/23/20 2147    Terrilee Files, MD 04/24/20 [phone]

## 2020-04-23 NOTE — Discharge Instructions (Addendum)
Please continue to follow-up with Dr. Wynetta Emery.  Please continue reach out to his office in order to discuss your care. I'm prescribing you percocet. I recommend 1-2 tablets for pain at a time as prescribed. This contains tylenol and I therefore recommend no additional tylenol. Please use robaxin as well.   I am prescribing you prednisone as you requested.  Please use only after discussing with Dr. Wynetta Emery

## 2020-04-23 NOTE — Anesthesia Preprocedure Evaluation (Addendum)
Anesthesia Evaluation  Patient identified by MRN, date of birth, ID band Patient awake    Reviewed: Allergy & Precautions, NPO status , Patient's Chart, lab work & pertinent test results, reviewed documented beta blocker date and time   History of Anesthesia Complications (+) PONV  Airway Mallampati: I  TM Distance: >3 FB Neck ROM: Full    Dental  (+) Teeth Intact, Dental Advisory Given, Partial Lower,    Pulmonary pneumonia,    breath sounds clear to auscultation       Cardiovascular hypertension, Pt. on medications and Pt. on home beta blockers  Rhythm:Regular Rate:Normal     Neuro/Psych    GI/Hepatic negative GI ROS, Neg liver ROS,   Endo/Other  History noted CG  Renal/GU negative Renal ROS     Musculoskeletal   Abdominal   Peds  Hematology   Anesthesia Other Findings   Reproductive/Obstetrics                           Anesthesia Physical Anesthesia Plan  ASA: III  Anesthesia Plan: General   Post-op Pain Management:    Induction: Intravenous  PONV Risk Score and Plan: 3 and Ondansetron, Dexamethasone, Midazolam and Scopolamine patch - Pre-op  Airway Management Planned: Oral ETT  Additional Equipment: None  Intra-op Plan:   Post-operative Plan: Extubation in OR  Informed Consent: I have reviewed the patients History and Physical, chart, labs and discussed the procedure including the risks, benefits and alternatives for the proposed anesthesia with the patient or authorized representative who has indicated his/her understanding and acceptance.     Dental advisory given  Plan Discussed with: CRNA, Anesthesiologist and Surgeon  Anesthesia Plan Comments: (PAT note by Antionette Poles, PA-C Pt has history of PSVT and has undergone extensive benign cardiac workup. She is maintained on atenolol and continues to follow yearly with [facility]ology at [facility]. Last seen by Dr. Willeen Cass  12/27/19. Pre note, " Patient has done very well since last seen and denies any cardiac complaints. Palpitation is also better on current dose of atenolol. She also has back pain syndrome and needs to undergo back surgery. History of CAD and had a normal cardiac catheterization in 2016. She is not taking any statin for dyslipidemia. Otherwise her blood pressure is controlled on current therapy. Recent echo showed preserved LV function with mild valvular regurgitation." She was started on low dose crestor for HLD. She was advised to followup in a year.   Preop labs reviewed. Pre diabetic on metformin, A1c 6.2. Labs otherwise unremarkable.   EKG 12/27/2019 (copy on chart): Sinus bradycardia.  Rate 51.  Low voltage QRS.  TTE 12/28/2018 (copy on chart): Interpretation summary: A complete two-dimensional transthoracic echocardiogram was performed.  The study was technically adequate.  Compared to prior study, there is no significant change.  There is mild concentric left ventricular hypertrophy.  The left ventricle is normal in structure and function.  Ejection fraction is equal then sign >55%.  Cardiac cath 12/20/14 (care everywhere): Impression: 1. Normal coronary arteries 2. Normal LV function  Plan:  1. Aggressive medical therapy 2. Risk stratification )     Anesthesia Quick Evaluation

## 2020-04-23 NOTE — ED Notes (Signed)
Pt ambulated to BR

## 2020-04-23 NOTE — Progress Notes (Signed)
Anesthesia Chart Review:  Pt has history of PSVT and has undergone extensive benign cardiac workup. She is maintained on atenolol and continues to follow yearly with cardiology at [facility]. Last seen by Dr. Willeen Cass 12/27/19. Pre note, " Patient has done very well since last seen and denies any cardiac complaints. Palpitation is also better on current dose of atenolol. She also has back pain syndrome and needs to undergo back surgery. History of CAD and had a normal cardiac catheterization in 2016. She is not taking any statin for dyslipidemia. Otherwise her blood pressure is controlled on current therapy. Recent echo showed preserved LV function with mild valvular regurgitation." She was started on low dose crestor for HLD. She was advised to followup in a year.   Preop labs reviewed. Pre diabetic on metformin, A1c 6.2. Labs otherwise unremarkable.   EKG 12/27/2019 (copy on chart): Sinus bradycardia.  Rate 51.  Low voltage QRS.  TTE 12/28/2018 (copy on chart): Interpretation summary: A complete two-dimensional transthoracic echocardiogram was performed.  The study was technically adequate.  Compared to prior study, there is no significant change.  There is mild concentric left ventricular hypertrophy.  The left ventricle is normal in structure and function.  Ejection fraction is equal then sign >55%.  Cardiac cath 12/20/14 (care everywhere): Impression: 1. Normal coronary arteries 2. Normal LV function  Plan:  1. Aggressive medical therapy 2. Risk stratification  Zannie Cove [facility]/Anesthesiology Phone [phone] 04/23/2020 10:24 AM

## 2020-04-23 NOTE — ED Triage Notes (Signed)
Pt endorses right scapula pain that radiates down her right arm. States she has a spinal fusion surgery this week. Pt took tramadol and muscle relaxer this am. Denies CP or SOB.

## 2020-04-24 ENCOUNTER — Other Ambulatory Visit: Payer: Self-pay

## 2020-04-24 ENCOUNTER — Ambulatory Visit (HOSPITAL_COMMUNITY): Payer: Medicare HMO | Admitting: Physician Assistant

## 2020-04-24 ENCOUNTER — Encounter (HOSPITAL_COMMUNITY): Payer: Self-pay | Admitting: Neurosurgery

## 2020-04-24 ENCOUNTER — Ambulatory Visit (HOSPITAL_COMMUNITY): Payer: Medicare HMO

## 2020-04-24 ENCOUNTER — Encounter (HOSPITAL_COMMUNITY)
Admission: RE | Disposition: A | Discharge status: Home / Self Care | Payer: Self-pay | Source: Home / Self Care | Attending: Neurosurgery

## 2020-04-24 ENCOUNTER — Ambulatory Visit (HOSPITAL_COMMUNITY)
Admission: RE | Admit: 2020-04-24 | Discharge: 2020-04-25 | Disposition: A | Discharge status: Home / Self Care | Payer: Medicare HMO | Attending: Neurosurgery | Admitting: Neurosurgery

## 2020-04-24 ENCOUNTER — Ambulatory Visit (HOSPITAL_COMMUNITY): Payer: Medicare HMO | Admitting: Certified Registered"

## 2020-04-24 DIAGNOSIS — M4316 Spondylolisthesis, lumbar region: Secondary | ICD-10-CM | POA: Diagnosis present

## 2020-04-24 DIAGNOSIS — Z7982 Long term (current) use of aspirin: Secondary | ICD-10-CM | POA: Diagnosis not present

## 2020-04-24 DIAGNOSIS — Z419 Encounter for procedure for purposes other than remedying health state, unspecified: Secondary | ICD-10-CM

## 2020-04-24 DIAGNOSIS — M5416 Radiculopathy, lumbar region: Secondary | ICD-10-CM | POA: Insufficient documentation

## 2020-04-24 DIAGNOSIS — I10 Essential (primary) hypertension: Secondary | ICD-10-CM | POA: Insufficient documentation

## 2020-04-24 DIAGNOSIS — Z79899 Other long term (current) drug therapy: Secondary | ICD-10-CM | POA: Insufficient documentation

## 2020-04-24 DIAGNOSIS — R7303 Prediabetes: Secondary | ICD-10-CM | POA: Insufficient documentation

## 2020-04-24 DIAGNOSIS — M48061 Spinal stenosis, lumbar region without neurogenic claudication: Secondary | ICD-10-CM | POA: Insufficient documentation

## 2020-04-24 DIAGNOSIS — Z7984 Long term (current) use of oral hypoglycemic drugs: Secondary | ICD-10-CM | POA: Diagnosis not present

## 2020-04-24 DIAGNOSIS — Z86718 Personal history of other venous thrombosis and embolism: Secondary | ICD-10-CM | POA: Diagnosis not present

## 2020-04-24 LAB — GLUCOSE, CAPILLARY: Glucose-Capillary: 109 mg/dL — ABNORMAL HIGH (ref 70–99)

## 2020-04-24 SURGERY — POSTERIOR LUMBAR FUSION 1 LEVEL
Anesthesia: General | Site: Spine Lumbar | Laterality: Bilateral

## 2020-04-24 MED ORDER — PROPOFOL 10 MG/ML IV BOLUS
INTRAVENOUS | Status: DC | PRN
  Administered 2020-04-24: 140 mg via INTRAVENOUS

## 2020-04-24 MED ORDER — CEFAZOLIN SODIUM-DEXTROSE 2-4 GM/100ML-% IV SOLN
INTRAVENOUS | Status: AC
  Filled 2020-04-24: qty 100

## 2020-04-24 MED ORDER — ROPINIROLE HCL 1 MG PO TABS
1.5000 mg | ORAL_TABLET | Freq: Every evening | ORAL | Status: DC
  Administered 2020-04-24: 2 mg via ORAL
  Filled 2020-04-24: qty 2

## 2020-04-24 MED ORDER — METFORMIN HCL ER 500 MG PO TB24
500.0000 mg | ORAL_TABLET | Freq: Every day | ORAL | Status: DC
  Administered 2020-04-24: 500 mg via ORAL
  Filled 2020-04-24: qty 1

## 2020-04-24 MED ORDER — FENTANYL CITRATE (PF) 100 MCG/2ML IJ SOLN
INTRAMUSCULAR | Status: AC
  Filled 2020-04-24: qty 2

## 2020-04-24 MED ORDER — DEXAMETHASONE SODIUM PHOSPHATE 10 MG/ML IJ SOLN
INTRAMUSCULAR | Status: AC
  Filled 2020-04-24: qty 1

## 2020-04-24 MED ORDER — NYSTATIN 100000 UNIT/GM EX CREA
1.0000 "application " | TOPICAL_CREAM | Freq: Two times a day (BID) | CUTANEOUS | Status: DC
  Filled 2020-04-24: qty 15

## 2020-04-24 MED ORDER — ONDANSETRON HCL 4 MG/2ML IJ SOLN
INTRAMUSCULAR | Status: AC
  Filled 2020-04-24: qty 2

## 2020-04-24 MED ORDER — OXYCODONE HCL 5 MG PO TABS
ORAL_TABLET | ORAL | Status: AC
  Filled 2020-04-24: qty 2

## 2020-04-24 MED ORDER — GLYCOPYRROLATE PF 0.2 MG/ML IJ SOSY
PREFILLED_SYRINGE | INTRAMUSCULAR | Status: DC | PRN
  Administered 2020-04-24: .2 mg via INTRAVENOUS

## 2020-04-24 MED ORDER — ASPIRIN EC 81 MG PO TBEC
81.0000 mg | DELAYED_RELEASE_TABLET | Freq: Every day | ORAL | Status: DC
  Administered 2020-04-25: 81 mg via ORAL
  Filled 2020-04-24: qty 1

## 2020-04-24 MED ORDER — THROMBIN 20000 UNITS EX SOLR
CUTANEOUS | Status: AC
  Filled 2020-04-24: qty 20000

## 2020-04-24 MED ORDER — PANTOPRAZOLE SODIUM 40 MG PO TBEC: 40.0000 mg | DELAYED_RELEASE_TABLET | Freq: Every day | ORAL | Status: DC

## 2020-04-24 MED ORDER — BUPIVACAINE LIPOSOME 1.3 % IJ SUSP
INTRAMUSCULAR | Status: DC | PRN
  Administered 2020-04-24: 20 mL

## 2020-04-24 MED ORDER — PHENYLEPHRINE HCL (PRESSORS) 10 MG/ML IV SOLN
INTRAVENOUS | Status: AC
  Filled 2020-04-24: qty 1

## 2020-04-24 MED ORDER — ALUM & MAG HYDROXIDE-SIMETH 200-200-20 MG/5ML PO SUSP: 30.0000 mL | Freq: Four times a day (QID) | ORAL | Status: DC | PRN

## 2020-04-24 MED ORDER — PHENYLEPHRINE 40 MCG/ML (10ML) SYRINGE FOR IV PUSH (FOR BLOOD PRESSURE SUPPORT)
PREFILLED_SYRINGE | INTRAVENOUS | Status: AC
  Filled 2020-04-24: qty 10

## 2020-04-24 MED ORDER — GABAPENTIN 300 MG PO CAPS
300.0000 mg | ORAL_CAPSULE | Freq: Two times a day (BID) | ORAL | Status: DC
  Administered 2020-04-24 – 2020-04-25 (×2): 300 mg via ORAL
  Filled 2020-04-24 (×2): qty 1

## 2020-04-24 MED ORDER — 0.9 % SODIUM CHLORIDE (POUR BTL) OPTIME
TOPICAL | Status: DC | PRN
  Administered 2020-04-24: 1000 mL

## 2020-04-24 MED ORDER — MAGNESIUM 250 MG PO TABS: 250.0000 mg | ORAL_TABLET | Freq: Every day | ORAL | Status: DC

## 2020-04-24 MED ORDER — ROSUVASTATIN CALCIUM 5 MG PO TABS
10.0000 mg | ORAL_TABLET | Freq: Every day | ORAL | Status: DC
  Administered 2020-04-24: 10 mg via ORAL
  Filled 2020-04-24: qty 2

## 2020-04-24 MED ORDER — CEFAZOLIN SODIUM-DEXTROSE 2-4 GM/100ML-% IV SOLN
2.0000 g | Freq: Three times a day (TID) | INTRAVENOUS | Status: DC
  Administered 2020-04-24 – 2020-04-25 (×3): 2 g via INTRAVENOUS
  Filled 2020-04-24 (×3): qty 100

## 2020-04-24 MED ORDER — ROCURONIUM BROMIDE 10 MG/ML (PF) SYRINGE
PREFILLED_SYRINGE | INTRAVENOUS | Status: DC | PRN
  Administered 2020-04-24: 20 mg via INTRAVENOUS
  Administered 2020-04-24: 60 mg via INTRAVENOUS

## 2020-04-24 MED ORDER — SODIUM CHLORIDE 0.9% FLUSH: 3.0000 mL | INTRAVENOUS | Status: DC | PRN

## 2020-04-24 MED ORDER — SODIUM CHLORIDE 0.9 % IV SOLN
250.0000 mL | INTRAVENOUS | Status: DC
  Administered 2020-04-24: 250 mL via INTRAVENOUS

## 2020-04-24 MED ORDER — EPHEDRINE 5 MG/ML INJ
INTRAVENOUS | Status: AC
  Filled 2020-04-24: qty 10

## 2020-04-24 MED ORDER — MIDAZOLAM HCL 5 MG/5ML IJ SOLN
INTRAMUSCULAR | Status: DC | PRN
  Administered 2020-04-24: 2 mg via INTRAVENOUS

## 2020-04-24 MED ORDER — PHENOL 1.4 % MT LIQD: 1.0000 | OROMUCOSAL | Status: DC | PRN

## 2020-04-24 MED ORDER — PROPOFOL 10 MG/ML IV BOLUS
INTRAVENOUS | Status: AC
  Filled 2020-04-24: qty 20

## 2020-04-24 MED ORDER — SCOPOLAMINE 1 MG/3DAYS TD PT72
MEDICATED_PATCH | TRANSDERMAL | Status: AC
  Filled 2020-04-24: qty 1

## 2020-04-24 MED ORDER — MIDAZOLAM HCL 2 MG/2ML IJ SOLN
INTRAMUSCULAR | Status: AC
  Filled 2020-04-24: qty 2

## 2020-04-24 MED ORDER — ALBUMIN HUMAN 5 % IV SOLN
INTRAVENOUS | Status: DC | PRN
Start: 2020-04-24 — End: 2020-04-24

## 2020-04-24 MED ORDER — ONDANSETRON HCL 4 MG/2ML IJ SOLN
INTRAMUSCULAR | Status: DC | PRN
  Administered 2020-04-24: 4 mg via INTRAVENOUS

## 2020-04-24 MED ORDER — CEFAZOLIN SODIUM-DEXTROSE 2-4 GM/100ML-% IV SOLN
2.0000 g | INTRAVENOUS | Status: AC
  Administered 2020-04-24: 2 g via INTRAVENOUS

## 2020-04-24 MED ORDER — LIDOCAINE 2% (20 MG/ML) 5 ML SYRINGE
INTRAMUSCULAR | Status: DC | PRN
  Administered 2020-04-24: 80 mg via INTRAVENOUS

## 2020-04-24 MED ORDER — EPHEDRINE SULFATE-NACL 50-0.9 MG/10ML-% IV SOSY
PREFILLED_SYRINGE | INTRAVENOUS | Status: DC | PRN
  Administered 2020-04-24 (×2): 10 mg via INTRAVENOUS

## 2020-04-24 MED ORDER — CHLORHEXIDINE GLUCONATE CLOTH 2 % EX PADS: 6.0000 | MEDICATED_PAD | Freq: Once | CUTANEOUS | Status: DC

## 2020-04-24 MED ORDER — THROMBIN 20000 UNITS EX SOLR: CUTANEOUS | Status: DC | PRN

## 2020-04-24 MED ORDER — CALCIUM 1200 1200-1000 MG-UNIT PO CHEW: CHEWABLE_TABLET | Freq: Every day | ORAL | Status: DC

## 2020-04-24 MED ORDER — METHOCARBAMOL 500 MG PO TABS
500.0000 mg | ORAL_TABLET | Freq: Two times a day (BID) | ORAL | Status: DC
  Administered 2020-04-24: 500 mg via ORAL
  Filled 2020-04-24: qty 1

## 2020-04-24 MED ORDER — DEXAMETHASONE SODIUM PHOSPHATE 10 MG/ML IJ SOLN
10.0000 mg | Freq: Once | INTRAMUSCULAR | Status: AC
  Administered 2020-04-24: 10 mg via INTRAVENOUS

## 2020-04-24 MED ORDER — LIDOCAINE-EPINEPHRINE 1 %-1:100000 IJ SOLN
INTRAMUSCULAR | Status: AC
  Filled 2020-04-24: qty 1

## 2020-04-24 MED ORDER — ASCORBIC ACID 500 MG PO TABS
500.0000 mg | ORAL_TABLET | Freq: Every day | ORAL | Status: DC
  Administered 2020-04-25: 500 mg via ORAL
  Filled 2020-04-24: qty 1

## 2020-04-24 MED ORDER — HYDROCHLOROTHIAZIDE 25 MG PO TABS
25.0000 mg | ORAL_TABLET | Freq: Every day | ORAL | Status: DC
  Administered 2020-04-24: 25 mg via ORAL
  Filled 2020-04-24: qty 1

## 2020-04-24 MED ORDER — LIDOCAINE-EPINEPHRINE 1 %-1:100000 IJ SOLN
INTRAMUSCULAR | Status: DC | PRN
  Administered 2020-04-24: 10 mL

## 2020-04-24 MED ORDER — ACETAMINOPHEN 325 MG PO TABS
650.0000 mg | ORAL_TABLET | ORAL | Status: DC | PRN
  Administered 2020-04-24: 650 mg via ORAL
  Filled 2020-04-24: qty 2

## 2020-04-24 MED ORDER — HYDROMORPHONE HCL 1 MG/ML IJ SOLN
0.5000 mg | INTRAMUSCULAR | Status: DC | PRN
  Administered 2020-04-24: 0.5 mg via INTRAVENOUS
  Filled 2020-04-24: qty 0.5

## 2020-04-24 MED ORDER — SODIUM CHLORIDE 0.9 % IV SOLN: INTRAVENOUS | Status: DC | PRN

## 2020-04-24 MED ORDER — CYCLOBENZAPRINE HCL 10 MG PO TABS
ORAL_TABLET | ORAL | Status: AC
  Filled 2020-04-24: qty 1

## 2020-04-24 MED ORDER — VITAMIN D 25 MCG (1000 UNIT) PO TABS
5000.0000 [IU] | ORAL_TABLET | Freq: Every day | ORAL | Status: DC
  Administered 2020-04-25: 5000 [IU] via ORAL
  Filled 2020-04-24 (×3): qty 5

## 2020-04-24 MED ORDER — ONDANSETRON HCL 4 MG PO TABS: 4.0000 mg | ORAL_TABLET | Freq: Four times a day (QID) | ORAL | Status: DC | PRN

## 2020-04-24 MED ORDER — FENTANYL CITRATE (PF) 100 MCG/2ML IJ SOLN
25.0000 ug | INTRAMUSCULAR | Status: DC | PRN
  Administered 2020-04-24: 25 ug via INTRAVENOUS
  Administered 2020-04-24 (×2): 50 ug via INTRAVENOUS
  Administered 2020-04-24: 25 ug via INTRAVENOUS

## 2020-04-24 MED ORDER — CALCIUM CARBONATE-VITAMIN D 500-200 MG-UNIT PO TABS
2.0000 | ORAL_TABLET | Freq: Every day | ORAL | Status: DC
  Administered 2020-04-25: 2 via ORAL
  Filled 2020-04-24: qty 2

## 2020-04-24 MED ORDER — LACTATED RINGERS IV SOLN: INTRAVENOUS | Status: DC | PRN

## 2020-04-24 MED ORDER — ACETAMINOPHEN ER 650 MG PO TBCR: 650.0000 mg | EXTENDED_RELEASE_TABLET | Freq: Three times a day (TID) | ORAL | Status: DC | PRN

## 2020-04-24 MED ORDER — MECLIZINE HCL 25 MG PO TABS
25.0000 mg | ORAL_TABLET | Freq: Three times a day (TID) | ORAL | Status: DC | PRN
  Filled 2020-04-24: qty 1

## 2020-04-24 MED ORDER — PANTOPRAZOLE SODIUM 40 MG PO TBEC
40.0000 mg | DELAYED_RELEASE_TABLET | Freq: Every day | ORAL | Status: DC
  Administered 2020-04-24: 40 mg via ORAL
  Filled 2020-04-24: qty 1

## 2020-04-24 MED ORDER — PREDNISONE 50 MG PO TABS: 60.0000 mg | ORAL_TABLET | Freq: Every day | ORAL | Status: DC

## 2020-04-24 MED ORDER — MAGNESIUM OXIDE 400 (241.3 MG) MG PO TABS
200.0000 mg | ORAL_TABLET | Freq: Every day | ORAL | Status: DC
  Filled 2020-04-24: qty 1

## 2020-04-24 MED ORDER — SUFENTANIL CITRATE 50 MCG/ML IV SOLN
INTRAVENOUS | Status: DC | PRN
  Administered 2020-04-24: 5 ug via INTRAVENOUS
  Administered 2020-04-24 (×3): 10 ug via INTRAVENOUS

## 2020-04-24 MED ORDER — TIZANIDINE HCL 4 MG PO TABS
4.0000 mg | ORAL_TABLET | Freq: Four times a day (QID) | ORAL | Status: DC | PRN
  Administered 2020-04-25 (×2): 4 mg via ORAL
  Filled 2020-04-24 (×2): qty 1

## 2020-04-24 MED ORDER — LIDOCAINE 2% (20 MG/ML) 5 ML SYRINGE
INTRAMUSCULAR | Status: AC
  Filled 2020-04-24: qty 5

## 2020-04-24 MED ORDER — SCOPOLAMINE 1 MG/3DAYS TD PT72
MEDICATED_PATCH | TRANSDERMAL | Status: DC | PRN
  Administered 2020-04-24: 1 via TRANSDERMAL

## 2020-04-24 MED ORDER — ONDANSETRON HCL 4 MG/2ML IJ SOLN: 4.0000 mg | Freq: Four times a day (QID) | INTRAMUSCULAR | Status: DC | PRN

## 2020-04-24 MED ORDER — SUGAMMADEX SODIUM 200 MG/2ML IV SOLN
INTRAVENOUS | Status: DC | PRN
Start: 2020-04-24 — End: 2020-04-24
  Administered 2020-04-24: 200 mg via INTRAVENOUS

## 2020-04-24 MED ORDER — CYCLOBENZAPRINE HCL 10 MG PO TABS
10.0000 mg | ORAL_TABLET | Freq: Three times a day (TID) | ORAL | Status: DC | PRN
  Administered 2020-04-24: 10 mg via ORAL

## 2020-04-24 MED ORDER — PANTOPRAZOLE SODIUM 40 MG IV SOLR: 40.0000 mg | Freq: Every day | INTRAVENOUS | Status: DC

## 2020-04-24 MED ORDER — PHENYLEPHRINE HCL-NACL 10-0.9 MG/250ML-% IV SOLN
INTRAVENOUS | Status: DC | PRN
  Administered 2020-04-24: 25 ug/min via INTRAVENOUS

## 2020-04-24 MED ORDER — SODIUM CHLORIDE (PF) 0.9 % IJ SOLN
INTRAMUSCULAR | Status: AC
  Filled 2020-04-24: qty 10

## 2020-04-24 MED ORDER — ROCURONIUM BROMIDE 10 MG/ML (PF) SYRINGE
PREFILLED_SYRINGE | INTRAVENOUS | Status: AC
  Filled 2020-04-24: qty 10

## 2020-04-24 MED ORDER — ATENOLOL 25 MG PO TABS
25.0000 mg | ORAL_TABLET | Freq: Every day | ORAL | Status: DC
  Administered 2020-04-24: 25 mg via ORAL
  Filled 2020-04-24: qty 1

## 2020-04-24 MED ORDER — SODIUM CHLORIDE 0.9% FLUSH
3.0000 mL | Freq: Two times a day (BID) | INTRAVENOUS | Status: DC
  Administered 2020-04-24: 3 mL via INTRAVENOUS

## 2020-04-24 MED ORDER — OXYCODONE-ACETAMINOPHEN 5-325 MG PO TABS: 1.0000 | ORAL_TABLET | ORAL | Status: DC | PRN

## 2020-04-24 MED ORDER — HYDROMORPHONE HCL 1 MG/ML IJ SOLN: 0.2500 mg | INTRAMUSCULAR | Status: DC | PRN

## 2020-04-24 MED ORDER — DIAZEPAM 2 MG PO TABS: 2.0000 mg | ORAL_TABLET | ORAL | Status: DC | PRN

## 2020-04-24 MED ORDER — SUFENTANIL CITRATE 50 MCG/ML IV SOLN
INTRAVENOUS | Status: AC
  Filled 2020-04-24: qty 1

## 2020-04-24 MED ORDER — LORATADINE 10 MG PO TABS
10.0000 mg | ORAL_TABLET | Freq: Every day | ORAL | Status: DC
  Administered 2020-04-24 – 2020-04-25 (×2): 10 mg via ORAL
  Filled 2020-04-24 (×2): qty 1

## 2020-04-24 MED ORDER — PHENYLEPHRINE 40 MCG/ML (10ML) SYRINGE FOR IV PUSH (FOR BLOOD PRESSURE SUPPORT)
PREFILLED_SYRINGE | INTRAVENOUS | Status: DC | PRN
  Administered 2020-04-24: 40 ug via INTRAVENOUS
  Administered 2020-04-24 (×2): 80 ug via INTRAVENOUS

## 2020-04-24 MED ORDER — OXYCODONE HCL 5 MG PO TABS
10.0000 mg | ORAL_TABLET | ORAL | Status: DC | PRN
  Administered 2020-04-24 – 2020-04-25 (×6): 10 mg via ORAL
  Filled 2020-04-24 (×5): qty 2

## 2020-04-24 MED ORDER — TRAMADOL HCL 50 MG PO TABS: 50.0000 mg | ORAL_TABLET | Freq: Four times a day (QID) | ORAL | Status: DC | PRN

## 2020-04-24 MED ORDER — ACETAMINOPHEN 650 MG RE SUPP: 650.0000 mg | RECTAL | Status: DC | PRN

## 2020-04-24 MED ORDER — MENTHOL 3 MG MT LOZG: 1.0000 | LOZENGE | OROMUCOSAL | Status: DC | PRN

## 2020-04-24 SURGICAL SUPPLY — 72 items
BAG DECANTER FOR FLEXI CONT (MISCELLANEOUS) ×2 IMPLANT
BENZOIN TINCTURE PRP APPL 2/3 (GAUZE/BANDAGES/DRESSINGS) ×2 IMPLANT
BLADE CLIPPER SURG (BLADE) IMPLANT
BLADE SURG 11 STRL SS (BLADE) ×2 IMPLANT
BONE VIVIGEN FORMABLE 5.4CC (Bone Implant) ×2 IMPLANT
BUR CUTTER 7.0 ROUND (BURR) ×2 IMPLANT
BUR MATCHSTICK NEURO 3.0 LAGG (BURR) ×2 IMPLANT
CANISTER SUCT 3000ML PPV (MISCELLANEOUS) ×2 IMPLANT
CAP LOCKING (Cap) ×4 IMPLANT
CAP LOCKING 5.5 CREO (Cap) IMPLANT
CARTRIDGE OIL MAESTRO DRILL (MISCELLANEOUS) ×1 IMPLANT
CNTNR URN SCR LID CUP LEK RST (MISCELLANEOUS) ×1 IMPLANT
CONT SPEC 4OZ STRL OR WHT (MISCELLANEOUS) ×1
COVER BACK TABLE 60X90IN (DRAPES) ×2 IMPLANT
COVER WAND RF STERILE (DRAPES) ×2 IMPLANT
DECANTER SPIKE VIAL GLASS SM (MISCELLANEOUS) ×2 IMPLANT
DERMABOND ADVANCED (GAUZE/BANDAGES/DRESSINGS) ×1
DERMABOND ADVANCED .7 DNX12 (GAUZE/BANDAGES/DRESSINGS) ×1 IMPLANT
DIFFUSER DRILL AIR PNEUMATIC (MISCELLANEOUS) ×2 IMPLANT
DRAPE C-ARM 42X72 X-RAY (DRAPES) ×4 IMPLANT
DRAPE C-ARMOR (DRAPES) IMPLANT
DRAPE HALF SHEET 40X57 (DRAPES) IMPLANT
DRAPE LAPAROTOMY 100X72X124 (DRAPES) ×2 IMPLANT
DRAPE SURG 17X23 STRL (DRAPES) ×2 IMPLANT
DRSG OPSITE 4X5.5 SM (GAUZE/BANDAGES/DRESSINGS) ×2 IMPLANT
DRSG OPSITE POSTOP 4X6 (GAUZE/BANDAGES/DRESSINGS) ×2 IMPLANT
DURAPREP 26ML APPLICATOR (WOUND CARE) ×2 IMPLANT
ELECT REM PT RETURN 9FT ADLT (ELECTROSURGICAL) ×2
ELECTRODE REM PT RTRN 9FT ADLT (ELECTROSURGICAL) ×1 IMPLANT
EVACUATOR 3/16  PVC DRAIN (DRAIN)
EVACUATOR 3/16 PVC DRAIN (DRAIN) IMPLANT
GAUZE 4X4 16PLY RFD (DISPOSABLE) IMPLANT
GAUZE SPONGE 4X4 12PLY STRL (GAUZE/BANDAGES/DRESSINGS) ×2 IMPLANT
GLOVE BIO SURGEON STRL SZ7 (GLOVE) IMPLANT
GLOVE BIO SURGEON STRL SZ8 (GLOVE) ×4 IMPLANT
GLOVE BIOGEL PI IND STRL 7.0 (GLOVE) IMPLANT
GLOVE BIOGEL PI INDICATOR 7.0 (GLOVE)
GLOVE EXAM NITRILE XL STR (GLOVE) IMPLANT
GLOVE INDICATOR 8.5 STRL (GLOVE) ×4 IMPLANT
GOWN STRL REUS W/ TWL LRG LVL3 (GOWN DISPOSABLE) IMPLANT
GOWN STRL REUS W/ TWL XL LVL3 (GOWN DISPOSABLE) ×2 IMPLANT
GOWN STRL REUS W/TWL 2XL LVL3 (GOWN DISPOSABLE) IMPLANT
GOWN STRL REUS W/TWL LRG LVL3 (GOWN DISPOSABLE)
GOWN STRL REUS W/TWL XL LVL3 (GOWN DISPOSABLE) ×2
GRAFT BNE MATRIX VG FRMBL MD 5 (Bone Implant) IMPLANT
HEMOSTAT POWDER KIT SURGIFOAM (HEMOSTASIS) IMPLANT
KIT BASIN OR (CUSTOM PROCEDURE TRAY) ×2 IMPLANT
KIT TURNOVER KIT B (KITS) ×2 IMPLANT
MILL MEDIUM DISP (BLADE) ×2 IMPLANT
NDL HYPO 25X1 1.5 SAFETY (NEEDLE) ×1 IMPLANT
NEEDLE HYPO 25X1 1.5 SAFETY (NEEDLE) ×2 IMPLANT
NS IRRIG 1000ML POUR BTL (IV SOLUTION) ×2 IMPLANT
OIL CARTRIDGE MAESTRO DRILL (MISCELLANEOUS) ×2
PACK LAMINECTOMY NEURO (CUSTOM PROCEDURE TRAY) ×2 IMPLANT
PAD ARMBOARD 7.5X6 YLW CONV (MISCELLANEOUS) ×6 IMPLANT
ROD 40MM SPINAL (Rod) ×2 IMPLANT
SHAFT CREO 30MM (Neuro Prosthesis/Implant) ×4 IMPLANT
SPACER SUSTAIN TI 10X22 13 15D (Spacer) ×2 IMPLANT
SPACER SUSTAIN TI 9X22X12 15D (Spacer) IMPLANT
SPONGE LAP 4X18 RFD (DISPOSABLE) IMPLANT
SPONGE SURGIFOAM ABS GEL 100 (HEMOSTASIS) ×2 IMPLANT
STRIP CLOSURE SKIN 1/2X4 (GAUZE/BANDAGES/DRESSINGS) ×3 IMPLANT
SUT VIC AB 0 CT1 18XCR BRD8 (SUTURE) ×2 IMPLANT
SUT VIC AB 0 CT1 8-18 (SUTURE) ×2
SUT VIC AB 2-0 CT1 18 (SUTURE) ×2 IMPLANT
SUT VIC AB 4-0 PS2 27 (SUTURE) ×2 IMPLANT
TAP BONE CORT 5.0/4.0 (BIT) ×1 IMPLANT
TOWEL GREEN STERILE (TOWEL DISPOSABLE) ×2 IMPLANT
TOWEL GREEN STERILE FF (TOWEL DISPOSABLE) ×2 IMPLANT
TRAY FOLEY MTR SLVR 16FR STAT (SET/KITS/TRAYS/PACK) ×2 IMPLANT
TULIP CREP AMP 5.5MM (Orthopedic Implant) ×4 IMPLANT
WATER STERILE IRR 1000ML POUR (IV SOLUTION) ×2 IMPLANT

## 2020-04-24 NOTE — Transfer of Care (Signed)
Immediate Anesthesia Transfer of Care Note  Patient: Diana Mcdonald [facility]  Procedure(s) Performed: LUMBAR FOUR-FIVE POSTERIOR LUMBAR INTERBODY FUSION (Bilateral Spine Lumbar)  Patient Location: PACU  Anesthesia Type:General  Level of Consciousness: awake, alert  and oriented  Airway & Oxygen Therapy: Patient Spontanous Breathing and Patient connected to nasal cannula oxygen  Post-op Assessment: Report given to RN, Post -op Vital signs reviewed and stable and Patient moving all extremities X 4  Post vital signs: Reviewed and stable  Last Vitals:  Vitals Value Taken Time  BP 120/77 04/24/20 1144  Temp 36.6 C 04/24/20 1143  Pulse 54 04/24/20 1148  Resp 18 04/24/20 1148  SpO2 100 % 04/24/20 1148  Vitals shown include unvalidated device data.  Last Pain:  Vitals:   04/24/20 1143  TempSrc:   PainSc: 4       Patients Stated Pain Goal: 3 (04/24/20 0705)  Complications: No apparent anesthesia complications

## 2020-04-24 NOTE — Anesthesia Procedure Notes (Signed)
Procedure Name: Intubation Date/Time: 04/24/2020 8:35 AM Performed by: Moshe Salisbury, CRNA Pre-anesthesia Checklist: Patient identified, Emergency Drugs available, Suction available and Patient being monitored Patient Re-evaluated:Patient Re-evaluated prior to induction Oxygen Delivery Method: Circle System Utilized Preoxygenation: Pre-oxygenation with 100% oxygen Induction Type: IV induction Ventilation: Mask ventilation without difficulty Laryngoscope Size: Mac and 3 Grade View: Grade II Tube type: Oral Tube size: 7.5 mm Number of attempts: 1 Airway Equipment and Method: Stylet Placement Confirmation: ETT inserted through vocal cords under direct vision,  positive ETCO2 and breath sounds checked- equal and bilateral Secured at: 21 cm Tube secured with: Tape Dental Injury: Teeth and Oropharynx as per pre-operative assessment

## 2020-04-24 NOTE — Anesthesia Postprocedure Evaluation (Signed)
Anesthesia Post Note  Patient: Anamari Galeas Sweatman  Procedure(s) Performed: LUMBAR FOUR-FIVE POSTERIOR LUMBAR INTERBODY FUSION (Bilateral Spine Lumbar)     Patient location during evaluation: PACU Anesthesia Type: General Level of consciousness: awake Pain management: pain level controlled Vital Signs Assessment: post-procedure vital signs reviewed and stable Respiratory status: spontaneous breathing Cardiovascular status: stable Postop Assessment: no apparent nausea or vomiting Anesthetic complications: no    Last Vitals:  Vitals:   04/24/20 1503 04/24/20 1512  BP:  (!) 145/67  Pulse:  (!) 52  Resp:  19  Temp: 36.8 C   SpO2:  95%    Last Pain:  Vitals:   04/24/20 1413  TempSrc:   PainSc: 3                  Kayela Humphres

## 2020-04-24 NOTE — Op Note (Signed)
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  Posterior lumbar interbody fusion L4-5 utilizing the globus insert and rotate titanium cages packed with locally harvested autograft mixed with vivigen  3.  Cortical screw fixation L4-5 utilizing the globus Creo modular cortical screw set  Surgeon: Jillyn Hidden Joash Tony  Assistant: Hoyt Koch  Second assistant Julien Girt  Anesthesia: General  EBL: Minimal  HPI: 69 year old female previously undergone laminotomy discectomy on the right at L4-5 presented with progressive worsening back bilateral hip and leg pain work-up revealed a grade 1 spondylolisthesis severe spinal stenosis and instability.  Due to her failure conservative treatment imaging findings and progression of clinical syndrome I recommended decompression stabilization procedure at that level.  I extensively went over the risks and benefits of that operation with her as well as perioperative course expectations of outcome and alternatives of surgery and she understood and agreed to proceed forward.  Operative procedure: Patient was brought to the OR was due to general esthesia positioned prone the Wilson frame her back was prepped and draped in routine sterile fashion her old incision was identified and extended cephalad caudally after infiltration of 10 cc lidocaine with epi.  Subcutaneous tissue was dissected free and subperiosteal dissection was carried out exposing the facet joints and lamina of L4 and L5.  The correct level was identified by intraoperative x-ray.  Spinous process at L4 was then removed central decompression was begun  first working over the left side a complete medial facetectomy and radical foraminotomies of the L4 and L5 nerve roots were carried out in the left and then dissecting through the scar tissue performed another complete facetectomy and radical foraminotomies of the L4 and L5 nerve root removing a dense amount of scar and a lot of spur coming off the medial facet joint.  All this decompressed the central canal and L4 and L5 nerve roots bilaterally.  Then working in the disc base this was cleaned out bilaterally utilizing sequential distraction with an 11 distractor in place the left-sided endplates and disc was removed and the endplates were prepared and I initially inserted a 9 to 12 degree cage felt to be the right size and in the right position.  I then cleaned out the disc space and the other side and packed bone centrally but then I did identified that the left-sided cage had migrated and rotated implying that actually was too small so I remove this cage and sized up to a 10 to 1315 degree cage packed this cage inserted the left side and then inserted the right side and confirmed that all cages were in good position.  Then all 4 cortical screws were placed all screws excellent purchase of appropriate each step along the way and position.  At the end of screw placement all pedicles were identified both medial and palpated the lateral border the pedicles to confirm that all screws were in good position as well as confirmed by fluoroscopy.  Then assembled the heads advanced the screws little bit selected 40 mm rods anchored everything in place and compressed the L4 screw against L5.  Then inspected all the foramina to confirm patency and no migration of graft material.  Injected Exparel in the fascia the skin  was closed running 4 subcuticular subcuticular Dermabond benzoin Steri-Strips and a sterile dressing.  At the end the case all needle counts and sponge counts were correct.

## 2020-04-24 NOTE — H&P (Signed)
Diana Mcdonald is an 69 y.o. female.   Chief Complaint: Back bilateral hip and leg pain HPI: 69-year female progressive worsening back and bilateral hip and leg pain rating down L4-L5.  Severe spinal stenosis L4-5 due to patient progression of clinical syndrome imaging findings and failed conservative treatment I recommended decompression stabilization procedure L4 5.  I extensively gone over the risks and benefits of the operation as well as perioperative course expectations of outcome and alternatives of surgery and she understands and agrees to proceed forward.  Past Medical History:  Diagnosis Date  . DVT (deep venous thrombosis) (HCC)   . Pneumonia 2010   hospitalized at [facility] for 7 days - bacterian pneumonia - per pt  . PONV (postoperative nausea and vomiting)   . Pre-diabetes    per pt - told by Eulah Pont, MD she was pre diabetic and sees provider every 6 months for management  . Spinal stenosis     Past Surgical History:  Procedure Laterality Date  . ABDOMINAL HYSTERECTOMY    . CARDIAC CATHETERIZATION     within last 10 years (~2015 per pt) negative result - no stents placed  . LAMINOTOMY  02/2016   laminotomy, foraminotomy, and decompression L4-L5   . lateral menisus repair Right 11/20/2019   [facility] - Dr. Thamas Jaegers - per pt  . TONSILLECTOMY      History reviewed. No pertinent family history. Social History:  reports that she has never smoked. She has never used smokeless tobacco. She reports current alcohol use. She reports that she does not use drugs.  Allergies: No Known Allergies  Medications Prior to Admission  Medication Sig Dispense Refill  . acetaminophen (TYLENOL) 650 MG CR tablet Take 650 mg by mouth every 8 (eight) hours as needed for pain.     Marland Kitchen aspirin EC 81 MG tablet Take 81 mg by mouth daily.    Marland Kitchen atenolol (TENORMIN) 25 MG tablet Take 25 mg by mouth at bedtime.     . Calcium Carbonate-Vit D-Min (CALCIUM 1200 PO) Take 1,200 tablets  by mouth daily.    . cetirizine (ZYRTEC) 10 MG tablet Take 10 mg by mouth daily.    . Cholecalciferol (VITAMIN D3) 125 MCG (5000 UT) CAPS Take 5,000 Units by mouth daily.    Marland Kitchen gabapentin (NEURONTIN) 300 MG capsule Take 300 mg by mouth 2 (two) times daily.     . hydrochlorothiazide (HYDRODIURIL) 25 MG tablet Take 25 mg by mouth daily.    . Magnesium 250 MG TABS Take 250 mg by mouth daily.    . metFORMIN (GLUCOPHAGE-XR) 500 MG 24 hr tablet Take 500 mg by mouth daily with supper.    . methocarbamol (ROBAXIN) 500 MG tablet Take 1 tablet (500 mg total) by mouth 2 (two) times daily. 20 tablet 0  . nystatin cream (MYCOSTATIN) Apply 1 application topically in the morning and at bedtime.     Marland Kitchen omeprazole (PRILOSEC) 40 MG capsule Take 1 capsule (40 mg total) by mouth 2 (two) times daily. Please take this twice a day while on steroid dosepak (Patient taking differently: Take 40 mg by mouth 2 (two) times daily. )    . oxyCODONE-acetaminophen (PERCOCET/ROXICET) 5-325 MG tablet Take 1-2 tablets by mouth every 4 (four) hours as needed for up to 14 days for severe pain. 6 tablet 0  . rOPINIRole (REQUIP) 0.5 MG tablet Take 1.5-2 mg by mouth every evening.     . rosuvastatin (CRESTOR) 10 MG tablet Take 10  mg by mouth at bedtime.    Marland Kitchen tiZANidine (ZANAFLEX) 4 MG tablet Take 4 mg by mouth every 6 (six) hours as needed for muscle spasms.    . traMADol (ULTRAM) 50 MG tablet Take 1 tablet (50 mg total) by mouth every 6 (six) hours as needed for moderate pain. 20 tablet 0  . vitamin C (ASCORBIC ACID) 500 MG tablet Take 500 mg by mouth daily.    . diazepam (VALIUM) 2 MG tablet Take 2 mg by mouth See admin instructions. Take 2 mg sublingually as needed for vertigo, may repeat every 30 minutes until symptoms improve.  Do not exceed 4 tablets within 8 hours.     . lidocaine (LIDODERM) 5 % Place 1 patch onto the skin daily. Remove & Discard patch within 12 hours or as directed by MD (Patient not taking: Reported on 04/11/2020) 15  patch 0  . meclizine (ANTIVERT) 25 MG tablet Take 25 mg by mouth 3 (three) times daily as needed for dizziness.    . methylPREDNISolone (MEDROL DOSEPAK) 4 MG TBPK tablet Take 4mg  tab after breakfast, 4mg  after lunch and 4mg  after dinner for 2days Then 4mg  in am and then 4mg  after dinner for 2days Then 4mg  daily after breakfast for 2days  Then 2mg  (half) tab in am for 2days then STOP (Patient not taking: Reported on 04/11/2020) 14 tablet 0  . ondansetron (ZOFRAN) 4 MG tablet Take 4 mg by mouth every 6 (six) hours as needed for nausea or vomiting.    . predniSONE (DELTASONE) 10 MG tablet Take 6 tablets (60 mg total) by mouth daily for 5 days. 30 tablet 0    Results for orders placed or performed during the hospital encounter of 04/24/20 (from the past 48 hour(s))  Glucose, capillary     Status: Abnormal   Collection Time: 04/24/20  7:02 AM  Result Value Ref Range   Glucose-Capillary 109 (H) 70 - 99 mg/dL    Comment: Glucose reference range applies only to samples taken after fasting for at least 8 hours.   DG Chest 2 View  Result Date: 04/23/2020 CLINICAL DATA:  Right upper back pain. EXAM: CHEST - 2 VIEW COMPARISON:  None. FINDINGS: The heart size and mediastinal contours are within normal limits. Both lungs are clear. No pneumothorax or pleural effusion is noted. The visualized skeletal structures are unremarkable. IMPRESSION: No active cardiopulmonary disease. Electronically Signed   By: Marijo Conception M.D.   On: 04/23/2020 11:54    Review of Systems  Musculoskeletal: Positive for back pain.  Neurological: Positive for numbness.    Blood pressure 136/60, pulse (!) 51, temperature (!) 96.9 F (36.1 C), temperature source Tympanic, resp. rate 17, height 5\' 9"  (1.753 m), weight 102.1 kg, SpO2 96 %. Physical Exam  Constitutional: She is oriented to person, place, and time. She appears well-developed.  HENT:  Head: Normocephalic.  Eyes: Pupils are equal, round, and reactive to light.   Cardiovascular: Normal rate.  Respiratory: Effort normal.  GI: Soft.  Musculoskeletal:        General: Normal range of motion.     Cervical back: Normal range of motion.  Neurological: She is alert and oriented to person, place, and time. She has normal strength. GCS eye subscore is 4. GCS verbal subscore is 5. GCS motor subscore is 6.  ` Patient awake alert strength is 5 out of 5 iliopsoas, quads and hamstrings, gastrocs, into tibialis, EHL   Skin: Skin is warm and dry.  Assessment/Plan 69 presents for decompressive stabilization procedure  Jalesha Plotz P, MD 04/24/2020, 8:13 AM

## 2020-04-25 DIAGNOSIS — M48061 Spinal stenosis, lumbar region without neurogenic claudication: Secondary | ICD-10-CM | POA: Diagnosis not present

## 2020-04-25 LAB — BASIC METABOLIC PANEL
Anion gap: 7 (ref 5–15)
BUN: 18 mg/dL (ref 8–23)
CO2: 29 mmol/L (ref 22–32)
Calcium: 8.8 mg/dL — ABNORMAL LOW (ref 8.9–10.3)
Chloride: 101 mmol/L (ref 98–111)
Creatinine, Ser: 1.25 mg/dL — ABNORMAL HIGH (ref 0.44–1.00)
GFR calc Af Amer: 51 mL/min — ABNORMAL LOW (ref >60)
GFR calc non Af Amer: 44 mL/min — ABNORMAL LOW (ref >60)
Glucose, Bld: 156 mg/dL — ABNORMAL HIGH (ref 70–99)
Potassium: 5.3 mmol/L — ABNORMAL HIGH (ref 3.5–5.1)
Sodium: 137 mmol/L (ref 135–145)

## 2020-04-25 LAB — CBC
HCT: 31.5 % — ABNORMAL LOW (ref 36.0–46.0)
Hemoglobin: 9.7 g/dL — ABNORMAL LOW (ref 12.0–15.0)
MCH: 27.2 pg (ref 26.0–34.0)
MCHC: 30.8 g/dL (ref 30.0–36.0)
MCV: 88.2 fL (ref 80.0–100.0)
Platelets: 226 10*3/uL (ref 150–400)
RBC: 3.57 MIL/uL — ABNORMAL LOW (ref 3.87–5.11)
RDW: 14.6 % (ref 11.5–15.5)
WBC: 14.8 10*3/uL — ABNORMAL HIGH (ref 4.0–10.5)
nRBC: 0 % (ref 0.0–0.2)

## 2020-04-25 MED ORDER — OXYCODONE HCL 5 MG PO TABS
10.0000 mg | ORAL_TABLET | ORAL | Status: DC | PRN
  Administered 2020-04-25: 10 mg via ORAL
  Filled 2020-04-25: qty 2

## 2020-04-25 MED ORDER — SODIUM CHLORIDE 0.9 % IV BOLUS
500.0000 mL | Freq: Once | INTRAVENOUS | Status: AC
  Administered 2020-04-25: 500 mL via INTRAVENOUS

## 2020-04-25 MED ORDER — OXYCODONE-ACETAMINOPHEN 10-325 MG PO TABS: 1.0000 | ORAL_TABLET | ORAL | 0 refills | Status: AC | PRN

## 2020-04-25 MED ORDER — METHOCARBAMOL 500 MG PO TABS: 500.0000 mg | ORAL_TABLET | Freq: Four times a day (QID) | ORAL | 0 refills | Status: AC

## 2020-04-25 NOTE — Evaluation (Signed)
Physical Therapy Evaluation Patient Details Name: Diana Mcdonald MRN: [id number] DOB: March 16, 1951 Today's Date: 04/25/2020   History of Present Illness  69 y.o female s/p L4-5 PLIF. PMH includes HTN, DVT, restless leg, previous back sx. Was admitted 43mo ago for fall  Clinical Impression  Pt presented sitting upright at EOB, awake and willing to participate in therapy session. Pt's husband present throughout session as well. Prior to admission, pt reported that she was independent with all functional mobility and ADLs. Pt lives with her spouse in a two level home with two steps to enter. At the time of evaluation, pt at a supervision level with all functional mobility including hallway ambulation and stair training. PT provided pt education re: back precautions, car transfers and a generalized walking program for pt to initiate upon d/c home. No further acute PT needs identified at this time. PT signing off.     Follow Up Recommendations No PT follow up    Equipment Recommendations  None recommended by PT    Recommendations for Other Services       Precautions / Restrictions Precautions Precautions: Fall;Back Precaution Comments: reviewed 3/3 back precautions Required Braces or Orthoses: Spinal Brace Spinal Brace: Lumbar corset Restrictions Weight Bearing Restrictions: No      Mobility  Bed Mobility               General bed mobility comments: pt seated EOB upon arrival  Transfers Overall transfer level: Needs assistance Equipment used: None Transfers: Sit to/from Stand Sit to Stand: Supervision         General transfer comment: for safety  Ambulation/Gait Ambulation/Gait assistance: Supervision Gait Distance (Feet): 500 Feet Assistive device: None;1 person hand held assist Gait Pattern/deviations: Step-through pattern;Decreased stride length Gait velocity: decreased   General Gait Details: pt with mild instability but no overt LOB or need for physical  assistance; pt holding husband's hand throughout for comfort  Stairs Stairs: Yes Stairs assistance: Supervision Stair Management: One rail Right;Step to pattern;Forwards Number of Stairs: 3 General stair comments: no instability or LOB  Wheelchair Mobility    Modified Rankin (Stroke Patients Only)       Balance Overall balance assessment: Needs assistance Sitting-balance support: Feet supported Sitting balance-Leahy Scale: Good     Standing balance support: During functional activity Standing balance-Leahy Scale: Fair                               Pertinent Vitals/Pain Pain Assessment: Faces Faces Pain Scale: Hurts even more Pain Location: incision site Pain Descriptors / Indicators: Aching;Sore Pain Intervention(s): Monitored during session;Repositioned    Home Living Family/patient expects to be discharged to:: Private residence Living Arrangements: Spouse/significant other Available Help at Discharge: Family;Available 24 hours/day Type of Home: House Home Access: Stairs to enter   [employer] of Steps: 4 Home Layout: Able to live on main level with bedroom/bathroom Home Equipment: Cane - single point;Walker - 2 wheels;Shower seat      Prior Function Level of Independence: Independent               Hand Dominance        Extremity/Trunk Assessment   Upper Extremity Assessment Upper Extremity Assessment: Defer to OT evaluation;Overall WFL for tasks assessed    Lower Extremity Assessment Lower Extremity Assessment: Overall WFL for tasks assessed    Cervical / Trunk Assessment Cervical / Trunk Assessment: Other exceptions Cervical / Trunk Exceptions: s/p lumbar sx  Communication   Communication: No difficulties  Cognition Arousal/Alertness: Awake/alert Behavior During Therapy: WFL for tasks assessed/performed Overall Cognitive Status: Within Functional Limits for tasks assessed                                         General Comments      Exercises     Assessment/Plan    PT Assessment Patent does not need any further PT services  PT Problem List         PT Treatment Interventions      PT Goals (Current goals can be found in the Care Plan section)  Acute Rehab PT Goals Patient Stated Goal: return to independence PT Goal Formulation: All assessment and education complete, DC therapy    Frequency     Barriers to discharge        Co-evaluation               AM-PAC PT "6 Clicks" Mobility  Outcome Measure Help needed turning from your back to your side while in a flat bed without using bedrails?: None Help needed moving from lying on your back to sitting on the side of a flat bed without using bedrails?: None Help needed moving to and from a bed to a chair (including a wheelchair)?: None Help needed standing up from a chair using your arms (e.g., wheelchair or bedside chair)?: None Help needed to walk in hospital room?: None Help needed climbing 3-5 steps with a railing? : A Little 6 Click Score: 23    End of Session Equipment Utilized During Treatment: Back brace Activity Tolerance: Patient tolerated treatment well Patient left: in bed;with call bell/phone within reach;with family/visitor present;Other (comment)(seated EOB) Nurse Communication: Mobility status PT Visit Diagnosis: Other abnormalities of gait and mobility (R26.89)    Time: 1610-9604 PT Time Calculation (min) (ACUTE ONLY): 11 min   Charges:   PT Evaluation $PT Eval Low Complexity: 1 Low          Eduard Clos, PT, DPT  Acute Rehabilitation Services Pager [phone] Office [place] 04/25/2020, 3:09 PM

## 2020-04-25 NOTE — Evaluation (Signed)
Occupational Therapy Evaluation Patient Details Name: Diana Mcdonald MRN: [id number] DOB: 12/07/1951 Today's Date: 04/25/2020    History of Present Illness 69 y.o female s/p L4-5 PLIF. PMH includes HTN, DVT, restless leg, previous back sx. Was admitted 17mo ago for fall   Clinical Impression   PTA pt living with spouse, independent for BADL/IADL. At time of eval, pt completing bed mobility and sit <> stands with supervision level of assist. Educated pt on safe dressing, bathing, and toileting techniques for home. Back handout provided and reviewed adls in detail. Pt educated on: clothing between brace, never sleep in brace, set an alarm at night for medication, avoid sitting for long periods of time, correct bed positioning for sleeping, correct sequence for bed mobility, avoiding lifting more than 5 pounds and never wash directly over incision. All education is complete and patient indicates understanding. No further OT needs identified, OT will sign off. Thank you for this consult.     Follow Up Recommendations  No OT follow up;Supervision - Intermittent    Equipment Recommendations  None recommended by OT    Recommendations for Other Services       Precautions / Restrictions Precautions Precautions: Fall;Back Precaution Booklet Issued: Yes (comment) Precaution Comments: reviewed in context of BADL Required Braces or Orthoses: Spinal Brace Spinal Brace: Lumbar corset Restrictions Weight Bearing Restrictions: No      Mobility Bed Mobility Overal bed mobility: Needs Assistance Bed Mobility: Sidelying to Sit;Sit to Supine   Sidelying to sit: Supervision   Sit to supine: Supervision   General bed mobility comments: cues for technique  Transfers Overall transfer level: Needs assistance Equipment used: None;1 person hand held assist Transfers: Sit to/from Stand Sit to Stand: Supervision         General transfer comment: help onto husbands hand for support     Balance                                           ADL either performed or assessed with clinical judgement   ADL                                         General ADL Comments: Pt demonstrated ability to complete BADL at mod I level. Reviewed appropriate spinal precautions with dressing, bathing, toileting, and IADL routines. Pt can complete figure 4 method for LB dressing, toilet transfer and functional mobility without external assist.     Vision Baseline Vision/History: Wears glasses Wears Glasses: At all times Patient Visual Report: No change from baseline       Perception     Praxis      Pertinent Vitals/Pain Pain Assessment: 0-10 Pain Score: 6  Pain Location: incision site Pain Descriptors / Indicators: Aching;Sore Pain Intervention(s): Limited activity within patient's tolerance;Monitored during session;Repositioned     Hand Dominance     Extremity/Trunk Assessment Upper Extremity Assessment Upper Extremity Assessment: Overall WFL for tasks assessed   Lower Extremity Assessment Lower Extremity Assessment: Defer to PT evaluation       Communication Communication Communication: No difficulties   Cognition Arousal/Alertness: Awake/alert Behavior During Therapy: WFL for tasks assessed/performed Overall Cognitive Status: Within Functional Limits for tasks assessed  General Comments: reported feeling "foggy and out of it"   General Comments       Exercises     Shoulder Instructions      Home Living Family/patient expects to be discharged to:: Private residence Living Arrangements: Spouse/significant other Available Help at Discharge: Family;Available 24 hours/day Type of Home: House Home Access: Stairs to enter [employer] of Steps: 4   Home Layout: Able to live on main level with bedroom/bathroom     Bathroom Shower/Tub: Occupational psychologist:  Handicapped height     Home Equipment: [place] - single point;Walker - 2 wheels;Shower seat          Prior Functioning/Environment Level of Independence: Independent                 OT Problem List: Decreased knowledge of use of DME or AE;Decreased knowledge of precautions;Decreased activity tolerance;Pain      OT Treatment/Interventions:      OT Goals(Current goals can be found in the care plan section) Acute Rehab OT Goals Patient Stated Goal: return to independence OT Goal Formulation: All assessment and education complete, DC therapy  OT Frequency:     Barriers to D/C:            Co-evaluation              AM-PAC OT "6 Clicks" Daily Activity     Outcome Measure Help from another person eating meals?: None Help from another person taking care of personal grooming?: None Help from another person toileting, which includes using toliet, bedpan, or urinal?: None Help from another person bathing (including washing, rinsing, drying)?: None Help from another person to put on and taking off regular upper body clothing?: None Help from another person to put on and taking off regular lower body clothing?: None 6 Click Score: 24   End of Session Equipment Utilized During Treatment: Gait belt;Rolling walker;Back brace Nurse Communication: Mobility status  Activity Tolerance: Patient tolerated treatment well Patient left: in bed;with call bell/phone within reach  OT Visit Diagnosis: Unsteadiness on feet (R26.81);Other abnormalities of gait and mobility (R26.89);Pain;History of falling (Z91.81) Pain - part of body: (back)                Time: 4332-9518 OT Time Calculation (min): 21 min Charges:  OT General Charges $OT Visit: 1 Visit OT Evaluation $OT Eval Low Complexity: 1 Low  Zenovia Jarred, MSOT, OTR/L Acute Rehabilitation Services [facility] Office Number: [phone] Pager: [phone]  Zenovia Jarred 04/25/2020, 10:29 AM

## 2020-04-25 NOTE — Progress Notes (Signed)
Pt and husband given D/C instructions with verbal understanding. Rx's were sent to pharmacy by MD. Pt's incision is clean and dry with no sign of infection. Pt's IV was removed prior to D/C. Pt D/C'd home via wheelchair per MD order. Pt is stable @ D/C and has no other needs at this time. Ashley Allred, RN  

## 2020-04-25 NOTE — Progress Notes (Signed)
Subjective: Patient reports low back pain but leg pain improved.   Objective: Vital signs in last 24 hours: Temp:  [97.7 F (36.5 C)-98.4 F (36.9 C)] 98.4 F (36.9 C) (05/20 0415) Pulse Rate:  [47-70] 54 (05/20 0415) Resp:  [12-20] 18 (05/20 0415) BP: (99-145)/(51-77) 99/51 (05/20 0415) SpO2:  [94 %-100 %] 97 % (05/20 0415)  Intake/Output from previous day: 05/19 0701 - 05/20 0700 In: 2020 [P.O.:120; I.V.:1300; IV Piggyback:600] Out: 975 [Urine:575; Blood:400] Intake/Output this shift: No intake/output data recorded.  Neurologic: Grossly normal  Lab Results: Lab Results  Component Value Date   WBC 11.2 (H) 04/23/2020   HGB 12.6 04/23/2020   HCT 41.2 04/23/2020   MCV 87.8 04/23/2020   PLT 294 04/23/2020   No results found for: INR, PROTIME BMET Lab Results  Component Value Date   NA 140 04/23/2020   K 4.1 04/23/2020   CL 102 04/23/2020   CO2 27 04/23/2020   GLUCOSE 120 (H) 04/23/2020   BUN 20 04/23/2020   CREATININE 1.34 (H) 04/23/2020   CALCIUM 9.3 04/23/2020    Studies/Results: DG Chest 2 View  Result Date: 04/23/2020 CLINICAL DATA:  Right upper back pain. EXAM: CHEST - 2 VIEW COMPARISON:  None. FINDINGS: The heart size and mediastinal contours are within normal limits. Both lungs are clear. No pneumothorax or pleural effusion is noted. The visualized skeletal structures are unremarkable. IMPRESSION: No active cardiopulmonary disease. Electronically Signed   By: Lupita Raider M.D.   On: 04/23/2020 11:54   DG Lumbar Spine 2-3 Views  Result Date: 04/24/2020 CLINICAL DATA:  L4-5 lumbar interbody fusion. EXAM: LUMBAR SPINE - 2-3 VIEW; DG C-ARM 1-60 MIN COMPARISON:  10/18/2019 FINDINGS: Multiple C-arm images show posterior decompression, diskectomy and fusion procedure in progress at L4-5. Interbody spacer and bilateral pedicle screws appear grossly well positioned. Posterior rods not yet placed. IMPRESSION: PLIF in progress L4-5.  Good radiographic appearance.  Electronically Signed   By: Paulina Fusi M.D.   On: 04/24/2020 11:28   DG C-Arm 1-60 Min  Result Date: 04/24/2020 CLINICAL DATA:  L4-5 lumbar interbody fusion. EXAM: LUMBAR SPINE - 2-3 VIEW; DG C-ARM 1-60 MIN COMPARISON:  10/18/2019 FINDINGS: Multiple C-arm images show posterior decompression, diskectomy and fusion procedure in progress at L4-5. Interbody spacer and bilateral pedicle screws appear grossly well positioned. Posterior rods not yet placed. IMPRESSION: PLIF in progress L4-5.  Good radiographic appearance. Electronically Signed   By: Paulina Fusi M.D.   On: 04/24/2020 11:28    Assessment/Plan: Postop day 1 lumbar fusion. Continue therapies. Will discharge this afternoon if her bp improves. Apparently demanded that the nurse give her atenolol last night even after low bp. Discussed not taking any bp medication today and spacing out her pain medication more to see if this helps. If her bp does not improve we will draw a cbc and bmet.    LOS: 0 days    Tiana Loft [facility] 04/25/2020, 8:42 AM

## 2020-04-25 NOTE — Discharge Summary (Signed)
Physician Discharge Summary  Patient ID: Diana Mcdonald MRN: [id number] DOB/AGE: 69-Jun-1952 69 y.o.  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 The patient tolerated the procedure well and was taken to the recovery room and then to the floor in stable condition. The hospital course was routine. There were no complications. The wound remained clean dry and intact. Pt had appropriate back soreness. No complaints of leg pain or new N/T/W. The patient remained afebrile with stable vital signs, and tolerated a regular diet. The patient continued to increase activities, and pain was well controlled with oral pain medications.   Consults: None  Significant Diagnostic Studies:  Results for orders placed or performed during the hospital encounter of 04/24/20  Glucose, capillary  Result Value Ref Range   Glucose-Capillary 109 (H) 70 - 99 mg/dL  CBC  Result Value Ref Range   WBC 14.8 (H) 4.0 - 10.5 K/uL   RBC 3.57 (L) 3.87 - 5.11 MIL/uL   Hemoglobin 9.7 (L) 12.0 - 15.0 g/dL   HCT 40.9 (L) 81.1 - 91.4 %   MCV 88.2 80.0 - 100.0 fL   MCH 27.2 26.0 - 34.0 pg   MCHC 30.8 30.0 - 36.0 g/dL   RDW 78.2 95.6 - 21.3 %   Platelets 226 150 - 400 K/uL   nRBC 0.0 0.0 - 0.2 %  Basic metabolic panel  Result Value Ref Range   Sodium 137 135 - 145 mmol/L   Potassium 5.3 (H) 3.5 - 5.1 mmol/L   Chloride 101 98 - 111 mmol/L   CO2 29 22 - 32 mmol/L   Glucose, Bld 156 (H) 70 - 99 mg/dL   BUN 18 8 - 23 mg/dL   Creatinine, Ser 0.86 (H) 0.44 - 1.00 mg/dL   Calcium 8.8 (L) 8.9 - 10.3 mg/dL   GFR calc non Af Amer 44 (L) >60 mL/min   GFR calc Af Amer 51 (L) >60 mL/min   Anion gap 7 5 - 15    DG Chest 2  View  Result Date: 04/23/2020 CLINICAL DATA:  Right upper back pain. EXAM: CHEST - 2 VIEW COMPARISON:  None. FINDINGS: The heart size and mediastinal contours are within normal limits. Both lungs are clear. No pneumothorax or pleural effusion is noted. The visualized skeletal structures are unremarkable. IMPRESSION: No active cardiopulmonary disease. Electronically Signed   By: Lupita Raider M.D.   On: 04/23/2020 11:54   DG Lumbar Spine 2-3 Views  Result Date: 04/24/2020 CLINICAL DATA:  L4-5 lumbar interbody fusion. EXAM: LUMBAR SPINE - 2-3 VIEW; DG C-ARM 1-60 MIN COMPARISON:  10/18/2019 FINDINGS: Multiple C-arm images show posterior decompression, diskectomy and fusion procedure in progress at L4-5. Interbody spacer and bilateral pedicle screws appear grossly well positioned. Posterior rods not yet placed. IMPRESSION: PLIF in progress L4-5.  Good radiographic appearance. Electronically Signed   By: Paulina Fusi M.D.   On: 04/24/2020 11:28   DG C-Arm 1-60 Min  Result Date: 04/24/2020 CLINICAL DATA:  L4-5 lumbar interbody fusion. EXAM: LUMBAR SPINE - 2-3 VIEW; DG C-ARM 1-60 MIN COMPARISON:  10/18/2019 FINDINGS: Multiple C-arm images show posterior decompression, diskectomy and fusion procedure in progress at L4-5. Interbody spacer and bilateral pedicle screws appear grossly well positioned. Posterior rods not yet placed.  IMPRESSION: PLIF in progress L4-5.  Good radiographic appearance. Electronically Signed   By: Paulina Fusi M.D.   On: 04/24/2020 11:28    Antibiotics:  Anti-infectives (From admission, onward)   Start     Dose/Rate Route Frequency Ordered Stop   04/24/20 1630  ceFAZolin (ANCEF) IVPB 2g/100 mL premix     2 g 200 mL/hr over 30 Minutes Intravenous Every 8 hours 04/24/20 1508 04/26/20 1629   04/24/20 0953  bacitracin 50,000 Units in sodium chloride 0.9 % 500 mL irrigation  Status:  Discontinued       As needed 04/24/20 0954 04/24/20 1137   04/24/20 0700  ceFAZolin (ANCEF) IVPB  2g/100 mL premix     2 g 200 mL/hr over 30 Minutes Intravenous On call to O.R. 04/24/20 5643 04/24/20 0855   04/24/20 0657  ceFAZolin (ANCEF) 2-4 GM/100ML-% IVPB    Note to Pharmacy: Tawanna Sat   : cabinet override      04/24/20 0657 04/24/20 0909      Discharge Exam: Blood pressure (!) 96/47, pulse (!) 52, temperature (!) 97.5 F (36.4 C), temperature source Oral, resp. rate 16, height 5\' 9"  (1.753 m), weight 102.1 kg, SpO2 95 %. Neurologic: Grossly normal Ambulating and voiding well, incision cdi  Discharge Medications:   Allergies as of 04/25/2020   No Known Allergies     Medication List    STOP taking these medications   methylPREDNISolone 4 MG Tbpk tablet Commonly known as: MEDROL DOSEPAK   oxyCODONE-acetaminophen 5-325 MG tablet Commonly known as: PERCOCET/ROXICET Replaced by: oxyCODONE-acetaminophen 10-325 MG tablet   traMADol 50 MG tablet Commonly known as: ULTRAM     TAKE these medications   acetaminophen 650 MG CR tablet Commonly known as: TYLENOL Take 650 mg by mouth every 8 (eight) hours as needed for pain.   aspirin EC 81 MG tablet Take 81 mg by mouth daily.   atenolol 25 MG tablet Commonly known as: TENORMIN Take 25 mg by mouth at bedtime.   CALCIUM 1200 PO Take 1,200 tablets by mouth daily.   cetirizine 10 MG tablet Commonly known as: ZYRTEC Take 10 mg by mouth daily.   diazepam 2 MG tablet Commonly known as: VALIUM Take 2 mg by mouth See admin instructions. Take 2 mg sublingually as needed for vertigo, may repeat every 30 minutes until symptoms improve.  Do not exceed 4 tablets within 8 hours.   gabapentin 300 MG capsule Commonly known as: NEURONTIN Take 300 mg by mouth 2 (two) times daily.   hydrochlorothiazide 25 MG tablet Commonly known as: HYDRODIURIL Take 25 mg by mouth daily.   lidocaine 5 % Commonly known as: LIDODERM Place 1 patch onto the skin daily. Remove & Discard patch within 12 hours or as directed by MD   Magnesium  250 MG Tabs Take 250 mg by mouth daily.   meclizine 25 MG tablet Commonly known as: ANTIVERT Take 25 mg by mouth 3 (three) times daily as needed for dizziness.   metFORMIN 500 MG 24 hr tablet Commonly known as: GLUCOPHAGE-XR Take 500 mg by mouth daily with supper.   methocarbamol 500 MG tablet Commonly known as: ROBAXIN Take 1 tablet (500 mg total) by mouth 2 (two) times daily. What changed: Another medication with the same name was added. Make sure you understand how and when to take each.   methocarbamol 500 MG tablet Commonly known as: Robaxin Take 1 tablet (500 mg total) by mouth 4 (four) times daily. What changed: You were already  taking a medication with the same name, and this prescription was added. Make sure you understand how and when to take each.   nystatin cream Commonly known as: MYCOSTATIN Apply 1 application topically in the morning and at bedtime.   omeprazole 40 MG capsule Commonly known as: PRILOSEC Take 1 capsule (40 mg total) by mouth 2 (two) times daily. Please take this twice a day while on steroid dosepak What changed: additional instructions   ondansetron 4 MG tablet Commonly known as: ZOFRAN Take 4 mg by mouth every 6 (six) hours as needed for nausea or vomiting.   oxyCODONE-acetaminophen 10-325 MG tablet Commonly known as: Percocet Take 1 tablet by mouth every 4 (four) hours as needed for pain. Replaces: oxyCODONE-acetaminophen 5-325 MG tablet   predniSONE 10 MG tablet Commonly known as: DELTASONE Take 6 tablets (60 mg total) by mouth daily for 5 days.   rOPINIRole 0.5 MG tablet Commonly known as: REQUIP Take 1.5-2 mg by mouth every evening.   rosuvastatin 10 MG tablet Commonly known as: CRESTOR Take 10 mg by mouth at bedtime.   tiZANidine 4 MG tablet Commonly known as: ZANAFLEX Take 4 mg by mouth every 6 (six) hours as needed for muscle spasms.   vitamin C 500 MG tablet Commonly known as: ASCORBIC ACID Take 500 mg by mouth daily.    Vitamin D3 125 MCG (5000 UT) Caps Take 5,000 Units by mouth daily.       Disposition: home   Final Dx: PLIF L4-5  Discharge Instructions     Remove dressing in 72 hours   Complete by: As directed    Call MD for:  difficulty breathing, headache or visual disturbances   Complete by: As directed    Call MD for:  hives   Complete by: As directed    Call MD for:  persistant dizziness or light-headedness   Complete by: As directed    Call MD for:  persistant nausea and vomiting   Complete by: As directed    Call MD for:  redness, tenderness, or signs of infection (pain, swelling, redness, odor or green/yellow discharge around incision site)   Complete by: As directed    Call MD for:  severe uncontrolled pain   Complete by: As directed    Call MD for:  temperature >100.4   Complete by: As directed    Diet - low sodium heart healthy   Complete by: As directed    Driving Restrictions   Complete by: As directed    No driving for 2 weeks, no riding in the car for 1 week   Increase activity slowly   Complete by: As directed    Lifting restrictions   Complete by: As directed    No lifting more than 8 lbs         Signed: Ocie Cornfield Adryana Mogensen 04/25/2020, 3:54 PM

## 2020-05-09 ENCOUNTER — Other Ambulatory Visit: Payer: Self-pay | Admitting: Neurosurgery

## 2020-05-14 ENCOUNTER — Other Ambulatory Visit: Payer: Self-pay

## 2020-05-14 ENCOUNTER — Encounter (HOSPITAL_COMMUNITY): Payer: Self-pay | Admitting: Neurosurgery

## 2020-05-14 NOTE — Progress Notes (Signed)
Spoke with pt for pre-op call. Pt just had surgery on lower back on 04/24/20. PAT was done for that surgery. Pt has hx of SVT and is on Atenolol. Pt's cardiologist is Dr. Willeen Cass. Last dose of Aspirin was 05/10/20. She states it's been under control for years with the Atenolol. Pt is pre- diabetic. Last A1C was 6.2 on 04/22/20.  Covid test will be done on arrival day of surgery.

## 2020-05-15 ENCOUNTER — Encounter (HOSPITAL_COMMUNITY)
Admission: RE | Disposition: A | Discharge status: Home / Self Care | Payer: Self-pay | Source: Home / Self Care | Attending: Neurosurgery

## 2020-05-15 ENCOUNTER — Ambulatory Visit (HOSPITAL_COMMUNITY): Payer: Medicare HMO | Admitting: Certified Registered Nurse Anesthetist

## 2020-05-15 ENCOUNTER — Ambulatory Visit (HOSPITAL_COMMUNITY)
Admission: RE | Admit: 2020-05-15 | Discharge: 2020-05-16 | Disposition: A | Discharge status: Home / Self Care | Payer: Medicare HMO | Attending: Neurosurgery | Admitting: Neurosurgery

## 2020-05-15 ENCOUNTER — Encounter (HOSPITAL_COMMUNITY): Payer: Self-pay | Admitting: Neurosurgery

## 2020-05-15 ENCOUNTER — Ambulatory Visit (HOSPITAL_COMMUNITY): Payer: Medicare HMO

## 2020-05-15 DIAGNOSIS — M79601 Pain in right arm: Secondary | ICD-10-CM | POA: Diagnosis not present

## 2020-05-15 DIAGNOSIS — I471 Supraventricular tachycardia: Secondary | ICD-10-CM | POA: Insufficient documentation

## 2020-05-15 DIAGNOSIS — Z20822 Contact with and (suspected) exposure to covid-19: Secondary | ICD-10-CM | POA: Diagnosis not present

## 2020-05-15 DIAGNOSIS — M25511 Pain in right shoulder: Secondary | ICD-10-CM | POA: Insufficient documentation

## 2020-05-15 DIAGNOSIS — Z981 Arthrodesis status: Secondary | ICD-10-CM | POA: Diagnosis not present

## 2020-05-15 DIAGNOSIS — Z419 Encounter for procedure for purposes other than remedying health state, unspecified: Secondary | ICD-10-CM

## 2020-05-15 DIAGNOSIS — M4802 Spinal stenosis, cervical region: Secondary | ICD-10-CM | POA: Diagnosis present

## 2020-05-15 DIAGNOSIS — M546 Pain in thoracic spine: Secondary | ICD-10-CM | POA: Diagnosis not present

## 2020-05-15 DIAGNOSIS — Z86718 Personal history of other venous thrombosis and embolism: Secondary | ICD-10-CM | POA: Diagnosis not present

## 2020-05-15 DIAGNOSIS — Z79899 Other long term (current) drug therapy: Secondary | ICD-10-CM | POA: Diagnosis not present

## 2020-05-15 DIAGNOSIS — Z7984 Long term (current) use of oral hypoglycemic drugs: Secondary | ICD-10-CM | POA: Diagnosis not present

## 2020-05-15 DIAGNOSIS — E119 Type 2 diabetes mellitus without complications: Secondary | ICD-10-CM | POA: Diagnosis not present

## 2020-05-15 DIAGNOSIS — M4722 Other spondylosis with radiculopathy, cervical region: Secondary | ICD-10-CM | POA: Diagnosis not present

## 2020-05-15 DIAGNOSIS — Z7982 Long term (current) use of aspirin: Secondary | ICD-10-CM | POA: Diagnosis not present

## 2020-05-15 HISTORY — DX: Supraventricular tachycardia, unspecified: I47.10

## 2020-05-15 HISTORY — PX: POSTERIOR CERVICAL LAMINECTOMY: SHX2248

## 2020-05-15 HISTORY — DX: Supraventricular tachycardia: I47.1

## 2020-05-15 LAB — BASIC METABOLIC PANEL
Anion gap: 16 — ABNORMAL HIGH (ref 5–15)
BUN: 21 mg/dL (ref 8–23)
CO2: 20 mmol/L — ABNORMAL LOW (ref 22–32)
Calcium: 9.1 mg/dL (ref 8.9–10.3)
Chloride: 104 mmol/L (ref 98–111)
Creatinine, Ser: 1.07 mg/dL — ABNORMAL HIGH (ref 0.44–1.00)
GFR calc Af Amer: >60 mL/min (ref >60)
GFR calc non Af Amer: 53 mL/min — ABNORMAL LOW (ref >60)
Glucose, Bld: 103 mg/dL — ABNORMAL HIGH (ref 70–99)
Potassium: 5.8 mmol/L — ABNORMAL HIGH (ref 3.5–5.1)
Sodium: 140 mmol/L (ref 135–145)

## 2020-05-15 LAB — CBC
HCT: 40.8 % (ref 36.0–46.0)
Hemoglobin: 12.1 g/dL (ref 12.0–15.0)
MCH: 26.6 pg (ref 26.0–34.0)
MCHC: 29.7 g/dL — ABNORMAL LOW (ref 30.0–36.0)
MCV: 89.7 fL (ref 80.0–100.0)
Platelets: 293 10*3/uL (ref 150–400)
RBC: 4.55 MIL/uL (ref 3.87–5.11)
RDW: 15.6 % — ABNORMAL HIGH (ref 11.5–15.5)
WBC: 11 10*3/uL — ABNORMAL HIGH (ref 4.0–10.5)
nRBC: 0 % (ref 0.0–0.2)

## 2020-05-15 LAB — GLUCOSE, CAPILLARY: Glucose-Capillary: 109 mg/dL — ABNORMAL HIGH (ref 70–99)

## 2020-05-15 LAB — SARS CORONAVIRUS 2 BY RT PCR (HOSPITAL ORDER, PERFORMED IN ~~LOC~~ HOSPITAL LAB): SARS Coronavirus 2: NEGATIVE

## 2020-05-15 SURGERY — POSTERIOR CERVICAL LAMINECTOMY
Anesthesia: General | Laterality: Right

## 2020-05-15 MED ORDER — LACTATED RINGERS IV SOLN: INTRAVENOUS | Status: DC

## 2020-05-15 MED ORDER — 0.9 % SODIUM CHLORIDE (POUR BTL) OPTIME
TOPICAL | Status: DC | PRN
  Administered 2020-05-15: 1000 mL

## 2020-05-15 MED ORDER — VITAMIN D 25 MCG (1000 UNIT) PO TABS
5000.0000 [IU] | ORAL_TABLET | Freq: Every day | ORAL | Status: DC
  Filled 2020-05-15 (×2): qty 5

## 2020-05-15 MED ORDER — ACETAMINOPHEN 10 MG/ML IV SOLN: 1000.0000 mg | Freq: Once | INTRAVENOUS | Status: DC | PRN

## 2020-05-15 MED ORDER — BACITRACIN ZINC 500 UNIT/GM EX OINT
TOPICAL_OINTMENT | CUTANEOUS | Status: DC | PRN
  Administered 2020-05-15: 1 via TOPICAL

## 2020-05-15 MED ORDER — CEFAZOLIN SODIUM-DEXTROSE 2-4 GM/100ML-% IV SOLN
2.0000 g | Freq: Three times a day (TID) | INTRAVENOUS | Status: AC
  Administered 2020-05-15 – 2020-05-16 (×2): 2 g via INTRAVENOUS
  Filled 2020-05-15 (×2): qty 100

## 2020-05-15 MED ORDER — FENTANYL CITRATE (PF) 250 MCG/5ML IJ SOLN
INTRAMUSCULAR | Status: DC | PRN
  Administered 2020-05-15: 50 ug via INTRAVENOUS
  Administered 2020-05-15: 150 ug via INTRAVENOUS

## 2020-05-15 MED ORDER — HYDROMORPHONE HCL 1 MG/ML IJ SOLN
0.5000 mg | INTRAMUSCULAR | Status: DC | PRN
  Administered 2020-05-15: 0.5 mg via INTRAVENOUS
  Filled 2020-05-15: qty 0.5

## 2020-05-15 MED ORDER — ATENOLOL 25 MG PO TABS
25.0000 mg | ORAL_TABLET | Freq: Every day | ORAL | Status: DC
  Administered 2020-05-15: 25 mg via ORAL
  Filled 2020-05-15: qty 1

## 2020-05-15 MED ORDER — DIAZEPAM 2 MG PO TABS: 2.0000 mg | ORAL_TABLET | ORAL | Status: DC | PRN

## 2020-05-15 MED ORDER — HYDROMORPHONE HCL 2 MG PO TABS
2.0000 mg | ORAL_TABLET | ORAL | Status: DC | PRN
  Administered 2020-05-15 – 2020-05-16 (×5): 4 mg via ORAL
  Filled 2020-05-15 (×5): qty 2

## 2020-05-15 MED ORDER — FENTANYL CITRATE (PF) 250 MCG/5ML IJ SOLN
INTRAMUSCULAR | Status: AC
  Filled 2020-05-15: qty 5

## 2020-05-15 MED ORDER — HYDROMORPHONE HCL 1 MG/ML IJ SOLN
0.2500 mg | INTRAMUSCULAR | Status: DC | PRN
  Administered 2020-05-15 (×2): 0.5 mg via INTRAVENOUS

## 2020-05-15 MED ORDER — ACETAMINOPHEN 160 MG/5ML PO SOLN: 325.0000 mg | Freq: Once | ORAL | Status: DC | PRN

## 2020-05-15 MED ORDER — CYCLOBENZAPRINE HCL 10 MG PO TABS
10.0000 mg | ORAL_TABLET | Freq: Three times a day (TID) | ORAL | Status: DC | PRN
  Administered 2020-05-16: 10 mg via ORAL
  Filled 2020-05-15: qty 1

## 2020-05-15 MED ORDER — DEXAMETHASONE SODIUM PHOSPHATE 10 MG/ML IJ SOLN
INTRAMUSCULAR | Status: DC | PRN
  Administered 2020-05-15: 10 mg via INTRAVENOUS

## 2020-05-15 MED ORDER — BUPIVACAINE HCL (PF) 0.25 % IJ SOLN
INTRAMUSCULAR | Status: DC | PRN
  Administered 2020-05-15: 8 mL

## 2020-05-15 MED ORDER — ALBUMIN HUMAN 5 % IV SOLN: INTRAVENOUS | Status: DC | PRN

## 2020-05-15 MED ORDER — ASPIRIN EC 81 MG PO TBEC
81.0000 mg | DELAYED_RELEASE_TABLET | Freq: Every day | ORAL | Status: DC
  Administered 2020-05-16: 81 mg via ORAL
  Filled 2020-05-15: qty 1

## 2020-05-15 MED ORDER — METHOCARBAMOL 500 MG PO TABS
ORAL_TABLET | ORAL | Status: AC
  Filled 2020-05-15: qty 1

## 2020-05-15 MED ORDER — ROCURONIUM BROMIDE 10 MG/ML (PF) SYRINGE
PREFILLED_SYRINGE | INTRAVENOUS | Status: AC
  Filled 2020-05-15: qty 10

## 2020-05-15 MED ORDER — ACETAMINOPHEN 325 MG PO TABS
650.0000 mg | ORAL_TABLET | ORAL | Status: DC | PRN
  Administered 2020-05-15: 650 mg via ORAL
  Filled 2020-05-15: qty 2

## 2020-05-15 MED ORDER — MAGNESIUM OXIDE 400 (241.3 MG) MG PO TABS: 200.0000 mg | ORAL_TABLET | Freq: Every day | ORAL | Status: DC

## 2020-05-15 MED ORDER — CHLORHEXIDINE GLUCONATE CLOTH 2 % EX PADS: 6.0000 | MEDICATED_PAD | Freq: Once | CUTANEOUS | Status: DC

## 2020-05-15 MED ORDER — SODIUM CHLORIDE 0.9 % IV SOLN
INTRAVENOUS | Status: DC | PRN
  Administered 2020-05-15: 500 mL

## 2020-05-15 MED ORDER — LIDOCAINE 2% (20 MG/ML) 5 ML SYRINGE
INTRAMUSCULAR | Status: DC | PRN
  Administered 2020-05-15: 40 mg via INTRAVENOUS

## 2020-05-15 MED ORDER — MENTHOL 3 MG MT LOZG: 1.0000 | LOZENGE | OROMUCOSAL | Status: DC | PRN

## 2020-05-15 MED ORDER — SUCCINYLCHOLINE CHLORIDE 200 MG/10ML IV SOSY
PREFILLED_SYRINGE | INTRAVENOUS | Status: DC | PRN
  Administered 2020-05-15: 120 mg via INTRAVENOUS

## 2020-05-15 MED ORDER — PROPOFOL 10 MG/ML IV BOLUS
INTRAVENOUS | Status: AC
  Filled 2020-05-15: qty 20

## 2020-05-15 MED ORDER — PHENOL 1.4 % MT LIQD: 1.0000 | OROMUCOSAL | Status: DC | PRN

## 2020-05-15 MED ORDER — LIDOCAINE 2% (20 MG/ML) 5 ML SYRINGE
INTRAMUSCULAR | Status: AC
  Filled 2020-05-15: qty 5

## 2020-05-15 MED ORDER — ONDANSETRON HCL 4 MG PO TABS: 4.0000 mg | ORAL_TABLET | Freq: Four times a day (QID) | ORAL | Status: DC | PRN

## 2020-05-15 MED ORDER — ONDANSETRON HCL 4 MG/2ML IJ SOLN
INTRAMUSCULAR | Status: DC | PRN
  Administered 2020-05-15: 4 mg via INTRAVENOUS

## 2020-05-15 MED ORDER — THROMBIN 5000 UNITS EX SOLR
CUTANEOUS | Status: DC | PRN
  Administered 2020-05-15: 10000 [IU] via TOPICAL

## 2020-05-15 MED ORDER — LORATADINE 10 MG PO TABS
10.0000 mg | ORAL_TABLET | Freq: Every day | ORAL | Status: DC
  Administered 2020-05-16: 10 mg via ORAL
  Filled 2020-05-15: qty 1

## 2020-05-15 MED ORDER — PHENYLEPHRINE 40 MCG/ML (10ML) SYRINGE FOR IV PUSH (FOR BLOOD PRESSURE SUPPORT)
PREFILLED_SYRINGE | INTRAVENOUS | Status: DC | PRN
  Administered 2020-05-15: 160 ug via INTRAVENOUS
  Administered 2020-05-15 (×2): 120 ug via INTRAVENOUS
  Administered 2020-05-15 (×5): 80 ug via INTRAVENOUS

## 2020-05-15 MED ORDER — OXYCODONE-ACETAMINOPHEN 10-325 MG PO TABS: 1.0000 | ORAL_TABLET | ORAL | Status: DC | PRN

## 2020-05-15 MED ORDER — DEXAMETHASONE SODIUM PHOSPHATE 10 MG/ML IJ SOLN
INTRAMUSCULAR | Status: AC
  Filled 2020-05-15: qty 1

## 2020-05-15 MED ORDER — THROMBIN 5000 UNITS EX SOLR
CUTANEOUS | Status: AC
  Filled 2020-05-15: qty 10000

## 2020-05-15 MED ORDER — CEFAZOLIN SODIUM-DEXTROSE 2-4 GM/100ML-% IV SOLN
2.0000 g | INTRAVENOUS | Status: AC
  Administered 2020-05-15: 2 g via INTRAVENOUS
  Filled 2020-05-15: qty 100

## 2020-05-15 MED ORDER — OXYCODONE HCL 5 MG PO TABS: 5.0000 mg | ORAL_TABLET | ORAL | Status: DC | PRN

## 2020-05-15 MED ORDER — HYDROMORPHONE HCL 2 MG PO TABS: 2.0000 mg | ORAL_TABLET | ORAL | Status: DC | PRN

## 2020-05-15 MED ORDER — SODIUM CHLORIDE 0.9 % IV SOLN
250.0000 mL | INTRAVENOUS | Status: DC
  Administered 2020-05-15: 250 mL via INTRAVENOUS

## 2020-05-15 MED ORDER — METHOCARBAMOL 500 MG PO TABS
500.0000 mg | ORAL_TABLET | Freq: Four times a day (QID) | ORAL | Status: DC
  Administered 2020-05-15 – 2020-05-16 (×3): 500 mg via ORAL
  Filled 2020-05-15 (×2): qty 1

## 2020-05-15 MED ORDER — PANTOPRAZOLE SODIUM 40 MG IV SOLR: 40.0000 mg | Freq: Every day | INTRAVENOUS | Status: DC

## 2020-05-15 MED ORDER — ACETAMINOPHEN 650 MG RE SUPP: 650.0000 mg | RECTAL | Status: DC | PRN

## 2020-05-15 MED ORDER — METOCLOPRAMIDE HCL 5 MG/ML IJ SOLN
INTRAMUSCULAR | Status: AC
  Filled 2020-05-15: qty 2

## 2020-05-15 MED ORDER — PHENYLEPHRINE 40 MCG/ML (10ML) SYRINGE FOR IV PUSH (FOR BLOOD PRESSURE SUPPORT)
PREFILLED_SYRINGE | INTRAVENOUS | Status: AC
  Filled 2020-05-15: qty 10

## 2020-05-15 MED ORDER — BUPIVACAINE HCL (PF) 0.25 % IJ SOLN
INTRAMUSCULAR | Status: AC
  Filled 2020-05-15: qty 30

## 2020-05-15 MED ORDER — SUCCINYLCHOLINE CHLORIDE 200 MG/10ML IV SOSY
PREFILLED_SYRINGE | INTRAVENOUS | Status: AC
  Filled 2020-05-15: qty 10

## 2020-05-15 MED ORDER — ROPINIROLE HCL 0.5 MG PO TABS
1.5000 mg | ORAL_TABLET | Freq: Every evening | ORAL | Status: DC
  Administered 2020-05-15: 2 mg via ORAL
  Filled 2020-05-15 (×2): qty 4

## 2020-05-15 MED ORDER — PANTOPRAZOLE SODIUM 40 MG PO TBEC
40.0000 mg | DELAYED_RELEASE_TABLET | Freq: Every day | ORAL | Status: DC
  Administered 2020-05-16: 40 mg via ORAL
  Filled 2020-05-15: qty 1

## 2020-05-15 MED ORDER — TIZANIDINE HCL 4 MG PO TABS
4.0000 mg | ORAL_TABLET | Freq: Four times a day (QID) | ORAL | Status: DC | PRN
  Administered 2020-05-16: 4 mg via ORAL
  Filled 2020-05-15: qty 1

## 2020-05-15 MED ORDER — ONDANSETRON HCL 4 MG/2ML IJ SOLN
INTRAMUSCULAR | Status: AC
  Filled 2020-05-15: qty 2

## 2020-05-15 MED ORDER — HYDROMORPHONE HCL 1 MG/ML IJ SOLN
INTRAMUSCULAR | Status: AC
  Filled 2020-05-15: qty 1

## 2020-05-15 MED ORDER — METHOCARBAMOL 500 MG PO TABS: 500.0000 mg | ORAL_TABLET | Freq: Two times a day (BID) | ORAL | Status: DC

## 2020-05-15 MED ORDER — CHLORHEXIDINE GLUCONATE 0.12 % MT SOLN: 15.0000 mL | Freq: Once | OROMUCOSAL | Status: AC

## 2020-05-15 MED ORDER — CHLORHEXIDINE GLUCONATE 0.12 % MT SOLN
OROMUCOSAL | Status: AC
  Administered 2020-05-15: 15 mL via OROMUCOSAL
  Filled 2020-05-15: qty 15

## 2020-05-15 MED ORDER — SUGAMMADEX SODIUM 200 MG/2ML IV SOLN
INTRAVENOUS | Status: DC | PRN
  Administered 2020-05-15: 200 mg via INTRAVENOUS

## 2020-05-15 MED ORDER — MIDAZOLAM HCL 2 MG/2ML IJ SOLN
INTRAMUSCULAR | Status: DC | PRN
  Administered 2020-05-15: 2 mg via INTRAVENOUS

## 2020-05-15 MED ORDER — HEMOSTATIC AGENTS (NO CHARGE) OPTIME
TOPICAL | Status: DC | PRN
  Administered 2020-05-15: 1 via TOPICAL

## 2020-05-15 MED ORDER — SCOPOLAMINE 1 MG/3DAYS TD PT72
MEDICATED_PATCH | TRANSDERMAL | Status: AC
  Filled 2020-05-15: qty 1

## 2020-05-15 MED ORDER — LIDOCAINE 5 % EX PTCH
1.0000 | MEDICATED_PATCH | CUTANEOUS | Status: DC
  Filled 2020-05-15 (×2): qty 1

## 2020-05-15 MED ORDER — SODIUM CHLORIDE 0.9% FLUSH: 3.0000 mL | Freq: Two times a day (BID) | INTRAVENOUS | Status: DC

## 2020-05-15 MED ORDER — ORAL CARE MOUTH RINSE: 15.0000 mL | Freq: Once | OROMUCOSAL | Status: AC

## 2020-05-15 MED ORDER — LACTATED RINGERS IV SOLN: INTRAVENOUS | Status: DC | PRN

## 2020-05-15 MED ORDER — ONDANSETRON HCL 4 MG/2ML IJ SOLN: 4.0000 mg | Freq: Four times a day (QID) | INTRAMUSCULAR | Status: DC | PRN

## 2020-05-15 MED ORDER — DIPHENHYDRAMINE HCL 50 MG/ML IJ SOLN
INTRAMUSCULAR | Status: AC
  Filled 2020-05-15: qty 1

## 2020-05-15 MED ORDER — LIDOCAINE-EPINEPHRINE 1 %-1:100000 IJ SOLN
INTRAMUSCULAR | Status: AC
  Filled 2020-05-15: qty 1

## 2020-05-15 MED ORDER — LIDOCAINE-EPINEPHRINE 1 %-1:100000 IJ SOLN
INTRAMUSCULAR | Status: DC | PRN
  Administered 2020-05-15: 10 mL

## 2020-05-15 MED ORDER — PROMETHAZINE HCL 25 MG/ML IJ SOLN: 6.2500 mg | INTRAMUSCULAR | Status: DC | PRN

## 2020-05-15 MED ORDER — METFORMIN HCL ER 500 MG PO TB24
500.0000 mg | ORAL_TABLET | Freq: Every day | ORAL | Status: DC
  Administered 2020-05-15: 500 mg via ORAL
  Filled 2020-05-15: qty 1

## 2020-05-15 MED ORDER — SODIUM CHLORIDE 0.9% FLUSH: 3.0000 mL | INTRAVENOUS | Status: DC | PRN

## 2020-05-15 MED ORDER — MECLIZINE HCL 25 MG PO TABS
25.0000 mg | ORAL_TABLET | Freq: Three times a day (TID) | ORAL | Status: DC | PRN
  Filled 2020-05-15: qty 1

## 2020-05-15 MED ORDER — HYDROCHLOROTHIAZIDE 25 MG PO TABS
25.0000 mg | ORAL_TABLET | Freq: Every day | ORAL | Status: DC
  Administered 2020-05-16: 25 mg via ORAL
  Filled 2020-05-15: qty 1

## 2020-05-15 MED ORDER — ROSUVASTATIN CALCIUM 5 MG PO TABS: 10.0000 mg | ORAL_TABLET | Freq: Every day | ORAL | Status: DC

## 2020-05-15 MED ORDER — ROCURONIUM BROMIDE 10 MG/ML (PF) SYRINGE
PREFILLED_SYRINGE | INTRAVENOUS | Status: DC | PRN
  Administered 2020-05-15: 30 mg via INTRAVENOUS
  Administered 2020-05-15: 50 mg via INTRAVENOUS

## 2020-05-15 MED ORDER — BACITRACIN ZINC 500 UNIT/GM EX OINT
TOPICAL_OINTMENT | CUTANEOUS | Status: AC
  Filled 2020-05-15: qty 28.35

## 2020-05-15 MED ORDER — PHENYLEPHRINE HCL-NACL 10-0.9 MG/250ML-% IV SOLN
INTRAVENOUS | Status: DC | PRN
  Administered 2020-05-15: 25 ug/min via INTRAVENOUS

## 2020-05-15 MED ORDER — MIDAZOLAM HCL 2 MG/2ML IJ SOLN
INTRAMUSCULAR | Status: AC
  Filled 2020-05-15: qty 2

## 2020-05-15 MED ORDER — GABAPENTIN 300 MG PO CAPS
300.0000 mg | ORAL_CAPSULE | Freq: Two times a day (BID) | ORAL | Status: DC
  Administered 2020-05-15 – 2020-05-16 (×2): 300 mg via ORAL
  Filled 2020-05-15 (×2): qty 1

## 2020-05-15 MED ORDER — NYSTATIN 100000 UNIT/GM EX CREA
1.0000 "application " | TOPICAL_CREAM | Freq: Two times a day (BID) | CUTANEOUS | Status: DC
  Administered 2020-05-15: 1 via TOPICAL
  Filled 2020-05-15: qty 15

## 2020-05-15 MED ORDER — ACETAMINOPHEN 325 MG PO TABS: 325.0000 mg | ORAL_TABLET | Freq: Once | ORAL | Status: DC | PRN

## 2020-05-15 MED ORDER — PROPOFOL 10 MG/ML IV BOLUS
INTRAVENOUS | Status: DC | PRN
  Administered 2020-05-15: 30 mg via INTRAVENOUS
  Administered 2020-05-15: 130 mg via INTRAVENOUS

## 2020-05-15 MED ORDER — OXYCODONE-ACETAMINOPHEN 5-325 MG PO TABS: 1.0000 | ORAL_TABLET | ORAL | Status: DC | PRN

## 2020-05-15 MED ORDER — PROPOFOL 500 MG/50ML IV EMUL
INTRAVENOUS | Status: DC | PRN
  Administered 2020-05-15: 25 ug/kg/min via INTRAVENOUS

## 2020-05-15 MED ORDER — METOCLOPRAMIDE HCL 5 MG/ML IJ SOLN
INTRAMUSCULAR | Status: DC | PRN
  Administered 2020-05-15: 10 mg via INTRAVENOUS

## 2020-05-15 MED ORDER — DIPHENHYDRAMINE HCL 50 MG/ML IJ SOLN
INTRAMUSCULAR | Status: DC | PRN
  Administered 2020-05-15: 12.5 mg via INTRAVENOUS

## 2020-05-15 MED ORDER — SCOPOLAMINE 1 MG/3DAYS TD PT72
MEDICATED_PATCH | TRANSDERMAL | Status: DC | PRN
  Administered 2020-05-15: 1 via TRANSDERMAL

## 2020-05-15 MED ORDER — CALCIUM CARBONATE-VITAMIN D 500-200 MG-UNIT PO TABS: 2.0000 | ORAL_TABLET | Freq: Every day | ORAL | Status: DC

## 2020-05-15 MED ORDER — ALUM & MAG HYDROXIDE-SIMETH 200-200-20 MG/5ML PO SUSP: 30.0000 mL | Freq: Four times a day (QID) | ORAL | Status: DC | PRN

## 2020-05-15 MED ORDER — GLYCOPYRROLATE 0.2 MG/ML IJ SOLN
INTRAMUSCULAR | Status: DC | PRN
Start: 2020-05-15 — End: 2020-05-15
  Administered 2020-05-15: .2 mg via INTRAVENOUS

## 2020-05-15 MED ORDER — ACETAMINOPHEN 325 MG PO TABS: 650.0000 mg | ORAL_TABLET | Freq: Three times a day (TID) | ORAL | Status: DC | PRN

## 2020-05-15 MED ORDER — MEPERIDINE HCL 25 MG/ML IJ SOLN: 6.2500 mg | INTRAMUSCULAR | Status: DC | PRN

## 2020-05-15 MED ORDER — ASCORBIC ACID 500 MG PO TABS: 500.0000 mg | ORAL_TABLET | Freq: Every day | ORAL | Status: DC

## 2020-05-15 MED ORDER — DEXAMETHASONE SODIUM PHOSPHATE 10 MG/ML IJ SOLN
10.0000 mg | Freq: Once | INTRAMUSCULAR | Status: DC
  Filled 2020-05-15: qty 1

## 2020-05-15 SURGICAL SUPPLY — 56 items
BAG DECANTER FOR FLEXI CONT (MISCELLANEOUS) ×2 IMPLANT
BAND RUBBER #18 3X1/16 STRL (MISCELLANEOUS) IMPLANT
BENZOIN TINCTURE PRP APPL 2/3 (GAUZE/BANDAGES/DRESSINGS) ×4 IMPLANT
BLADE CLIPPER SURG (BLADE) ×2 IMPLANT
BLADE SURG 11 STRL SS (BLADE) ×2 IMPLANT
BUR MATCHSTICK NEURO 3.0 LAGG (BURR) ×2 IMPLANT
CANISTER SUCT 3000ML PPV (MISCELLANEOUS) ×2 IMPLANT
CARTRIDGE OIL MAESTRO DRILL (MISCELLANEOUS) ×1 IMPLANT
COVER WAND RF STERILE (DRAPES) IMPLANT
DECANTER SPIKE VIAL GLASS SM (MISCELLANEOUS) ×2 IMPLANT
DERMABOND ADVANCED (GAUZE/BANDAGES/DRESSINGS) ×1
DERMABOND ADVANCED .7 DNX12 (GAUZE/BANDAGES/DRESSINGS) ×1 IMPLANT
DIFFUSER DRILL AIR PNEUMATIC (MISCELLANEOUS) ×2 IMPLANT
DRAPE C-ARM 42X72 X-RAY (DRAPES) ×4 IMPLANT
DRAPE LAPAROTOMY 100X72 PEDS (DRAPES) ×2 IMPLANT
DRAPE MICROSCOPE LEICA (MISCELLANEOUS) IMPLANT
DRAPE SURG 17X23 STRL (DRAPES) ×2 IMPLANT
DRSG OPSITE POSTOP 4X6 (GAUZE/BANDAGES/DRESSINGS) ×2 IMPLANT
DURAPREP 26ML APPLICATOR (WOUND CARE) ×2 IMPLANT
ELECT REM PT RETURN 9FT ADLT (ELECTROSURGICAL) ×2
ELECTRODE REM PT RTRN 9FT ADLT (ELECTROSURGICAL) ×1 IMPLANT
GAUZE 4X4 16PLY RFD (DISPOSABLE) ×2 IMPLANT
GAUZE SPONGE 4X4 12PLY STRL (GAUZE/BANDAGES/DRESSINGS) ×2 IMPLANT
GLOVE BIO SURGEON STRL SZ7 (GLOVE) IMPLANT
GLOVE BIO SURGEON STRL SZ8 (GLOVE) ×2 IMPLANT
GLOVE BIOGEL PI IND STRL 7.0 (GLOVE) IMPLANT
GLOVE BIOGEL PI INDICATOR 7.0 (GLOVE)
GLOVE EXAM NITRILE XL STR (GLOVE) IMPLANT
GLOVE INDICATOR 8.5 STRL (GLOVE) ×2 IMPLANT
GOWN STRL REUS W/ TWL LRG LVL3 (GOWN DISPOSABLE) IMPLANT
GOWN STRL REUS W/ TWL XL LVL3 (GOWN DISPOSABLE) ×1 IMPLANT
GOWN STRL REUS W/TWL 2XL LVL3 (GOWN DISPOSABLE) IMPLANT
GOWN STRL REUS W/TWL LRG LVL3 (GOWN DISPOSABLE)
GOWN STRL REUS W/TWL XL LVL3 (GOWN DISPOSABLE) ×1
KIT BASIN OR (CUSTOM PROCEDURE TRAY) ×2 IMPLANT
KIT TURNOVER KIT B (KITS) ×2 IMPLANT
MARKER SKIN DUAL TIP RULER LAB (MISCELLANEOUS) ×2 IMPLANT
NEEDLE HYPO 25X1 1.5 SAFETY (NEEDLE) ×2 IMPLANT
NEEDLE SPNL 20GX3.5 QUINCKE YW (NEEDLE) ×2 IMPLANT
NS IRRIG 1000ML POUR BTL (IV SOLUTION) ×2 IMPLANT
OIL CARTRIDGE MAESTRO DRILL (MISCELLANEOUS) ×2
PACK LAMINECTOMY NEURO (CUSTOM PROCEDURE TRAY) ×2 IMPLANT
PAD ARMBOARD 7.5X6 YLW CONV (MISCELLANEOUS) ×6 IMPLANT
PIN MAYFIELD SKULL DISP (PIN) ×2 IMPLANT
SPONGE LAP 4X18 RFD (DISPOSABLE) IMPLANT
SPONGE SURGIFOAM ABS GEL SZ50 (HEMOSTASIS) ×2 IMPLANT
STRIP CLOSURE SKIN 1/2X4 (GAUZE/BANDAGES/DRESSINGS) ×2 IMPLANT
SUT ETHILON 4 0 PS 2 18 (SUTURE) IMPLANT
SUT VIC AB 0 CT1 18XCR BRD8 (SUTURE) ×1 IMPLANT
SUT VIC AB 0 CT1 8-18 (SUTURE) ×1
SUT VIC AB 2-0 CT1 18 (SUTURE) ×2 IMPLANT
SUT VICRYL 4-0 PS2 18IN ABS (SUTURE) ×2 IMPLANT
TOWEL GREEN STERILE (TOWEL DISPOSABLE) ×2 IMPLANT
TOWEL GREEN STERILE FF (TOWEL DISPOSABLE) ×2 IMPLANT
TRAY FOLEY MTR SLVR 16FR STAT (SET/KITS/TRAYS/PACK) IMPLANT
WATER STERILE IRR 1000ML POUR (IV SOLUTION) ×2 IMPLANT

## 2020-05-15 NOTE — Transfer of Care (Signed)
Immediate Anesthesia Transfer of Care Note  Patient: Diana Mcdonald [facility]  Procedure(s) Performed: Posterior Cervical Laminectomy- Cervical Six- Cervical Seven - Cervical Seven- Thoracic One - right (Right )  Patient Location: PACU  Anesthesia Type:General  Level of Consciousness: awake and oriented  Airway & Oxygen Therapy: Patient Spontanous Breathing  Post-op Assessment: Report given to RN and Patient moving all extremities X 4  Post vital signs: Reviewed and stable  Last Vitals:  Vitals Value Taken Time  BP 122/58 05/15/20 1735  Temp    Pulse 85 05/15/20 1736  Resp 13 05/15/20 1736  SpO2 93 % 05/15/20 1736  Vitals shown include unvalidated device data.  Last Pain:  Vitals:   05/15/20 1155  TempSrc:   PainSc: 5       Patients Stated Pain Goal: 0 (05/15/20 1155)  Complications: No apparent anesthesia complications

## 2020-05-15 NOTE — H&P (Signed)
Diana Mcdonald is an 69 y.o. female.   Chief Complaint: Neck right shoulder and arm pain HPI: 69 year old female who underwent lumbar fusion several weeks ago and now presents with acute onset right shoulder and arm pain with weakness in the right hand.  Work-up revealed severe foraminal stenosis C6-7 and C7-T1 with a disc herniation at C7-T1 compressing the right C8 nerve root.  This correlated with her clinical syndrome due to her progression of clinical syndrome imaging findings of a conservative treatment I recommended posterior cervical foraminotomies at 6 7 on the right and 7 1 on the right.  I extensively gone over the risks and benefits of the operation with her as well as perioperative course expectations of outcome and alternatives of surgery and she understands and agrees to proceed forward.  Past Medical History:  Diagnosis Date  . DVT (deep venous thrombosis) (HCC)   . Pneumonia 2010   hospitalized at [facility] for 7 days - bacterian pneumonia - per pt  . PONV (postoperative nausea and vomiting)   . Pre-diabetes    per pt - told by Eulah Pont, MD she was pre diabetic and sees provider every 6 months for management  . Spinal stenosis   . SVT (supraventricular tachycardia) (HCC)    takes Atenolol    Past Surgical History:  Procedure Laterality Date  . ABDOMINAL HYSTERECTOMY    . CARDIAC CATHETERIZATION     within last 10 years (~2015 per pt) negative result - no stents placed  . LAMINOTOMY  02/2016   laminotomy, foraminotomy, and decompression L4-L5   . lateral menisus repair Right 11/20/2019   [facility] - Dr. Thamas Jaegers - per pt  . TONSILLECTOMY      History reviewed. No pertinent family history. Social History:  reports that she has never smoked. She has never used smokeless tobacco. She reports current alcohol use. She reports that she does not use drugs.  Allergies: No Known Allergies  Medications Prior to Admission  Medication Sig Dispense Refill   . acetaminophen (TYLENOL) 650 MG CR tablet Take 650 mg by mouth every 8 (eight) hours as needed for pain.     Marland Kitchen atenolol (TENORMIN) 25 MG tablet Take 25 mg by mouth at bedtime.     . Calcium Carbonate-Vit D-Min (CALCIUM 1200 PO) Take 1,200 tablets by mouth daily.    . cetirizine (ZYRTEC) 10 MG tablet Take 10 mg by mouth daily.    . Cholecalciferol (VITAMIN D3) 125 MCG (5000 UT) CAPS Take 5,000 Units by mouth daily.    Marland Kitchen gabapentin (NEURONTIN) 300 MG capsule Take 300 mg by mouth 2 (two) times daily.     . hydrochlorothiazide (HYDRODIURIL) 25 MG tablet Take 25 mg by mouth daily.    Marland Kitchen HYDROmorphone (DILAUDID) 2 MG tablet Take 2 mg by mouth every 4 (four) hours as needed for severe pain. Take 1-2 tabs every 4 hours    . Magnesium 250 MG TABS Take 250 mg by mouth daily.    . metFORMIN (GLUCOPHAGE-XR) 500 MG 24 hr tablet Take 500 mg by mouth daily with supper.    . methocarbamol (ROBAXIN) 500 MG tablet Take 1 tablet (500 mg total) by mouth 2 (two) times daily. (Patient taking differently: Take 500 mg by mouth 2 (two) times daily. Pt states that she is now taking 750 mg 1 tablet every 4 hours) 20 tablet 0  . nystatin cream (MYCOSTATIN) Apply 1 application topically in the morning and at bedtime.     Marland Kitchen  omeprazole (PRILOSEC) 40 MG capsule Take 1 capsule (40 mg total) by mouth 2 (two) times daily. Please take this twice a day while on steroid dosepak (Patient taking differently: Take 40 mg by mouth 2 (two) times daily. )    . rOPINIRole (REQUIP) 0.5 MG tablet Take 1.5-2 mg by mouth every evening.     . rosuvastatin (CRESTOR) 10 MG tablet Take 10 mg by mouth at bedtime.    . vitamin C (ASCORBIC ACID) 500 MG tablet Take 500 mg by mouth daily.    Marland Kitchen aspirin EC 81 MG tablet Take 81 mg by mouth daily.    . diazepam (VALIUM) 2 MG tablet Take 2 mg by mouth See admin instructions. Take 2 mg sublingually as needed for vertigo, may repeat every 30 minutes until symptoms improve.  Do not exceed 4 tablets within 8  hours.     . lidocaine (LIDODERM) 5 % Place 1 patch onto the skin daily. Remove & Discard patch within 12 hours or as directed by MD (Patient not taking: Reported on 04/11/2020) 15 patch 0  . meclizine (ANTIVERT) 25 MG tablet Take 25 mg by mouth 3 (three) times daily as needed for dizziness.    . methocarbamol (ROBAXIN) 500 MG tablet Take 1 tablet (500 mg total) by mouth 4 (four) times daily. 40 tablet 0  . ondansetron (ZOFRAN) 4 MG tablet Take 4 mg by mouth every 6 (six) hours as needed for nausea or vomiting.    Marland Kitchen oxyCODONE-acetaminophen (PERCOCET) 10-325 MG tablet Take 1 tablet by mouth every 4 (four) hours as needed for pain. 20 tablet 0  . tiZANidine (ZANAFLEX) 4 MG tablet Take 4 mg by mouth every 6 (six) hours as needed for muscle spasms.      Results for orders placed or performed during the hospital encounter of 05/15/20 (from the past 48 hour(s))  SARS Coronavirus 2 by RT PCR (hospital order, performed in [facility] hospital lab) Nasopharyngeal Nasopharyngeal Swab     Status: None   Collection Time: 05/15/20 10:50 AM   Specimen: Nasopharyngeal Swab  Result Value Ref Range   SARS Coronavirus 2 NEGATIVE NEGATIVE    Comment: (NOTE) SARS-CoV-2 target nucleic acids are NOT DETECTED. The SARS-CoV-2 RNA is generally detectable in upper and lower respiratory specimens during the acute phase of infection. The lowest concentration of SARS-CoV-2 viral copies this assay can detect is 250 copies / mL. A negative result does not preclude SARS-CoV-2 infection and should not be used as the sole basis for treatment or other patient management decisions.  A negative result may occur with improper specimen collection / handling, submission of specimen other than nasopharyngeal swab, presence of viral mutation(s) within the areas targeted by this assay, and inadequate number of viral copies (<250 copies / mL). A negative result must be combined with clinical observations, patient history, and  epidemiological information. Fact Sheet for Patients:   StrictlyIdeas.no Fact Sheet for Healthcare Providers: BankingDealers.co.za This test is not yet approved or cleared  by the [place] FDA and has been authorized for detection and/or diagnosis of SARS-CoV-2 by FDA under an Emergency Use Authorization (EUA).  This EUA will remain in effect (meaning this test can be used) for the duration of the COVID-19 declaration under Section 564(b)(1) of the Act, 21 U.S.C. section 360bbb-3(b)(1), unless the authorization is terminated or revoked sooner. Performed at [facility], [place] [address], [place], [place] [address]   Glucose, capillary     Status: Abnormal  Collection Time: 05/15/20 11:09 AM  Result Value Ref Range   Glucose-Capillary 109 (H) 70 - 99 mg/dL    Comment: Glucose reference range applies only to samples taken after fasting for at least 8 hours.  Basic metabolic panel     Status: Abnormal   Collection Time: 05/15/20 11:36 AM  Result Value Ref Range   Sodium 140 135 - 145 mmol/L   Potassium 5.8 (H) 3.5 - 5.1 mmol/L    Comment: HEMOLYSIS AT THIS LEVEL MAY AFFECT RESULT   Chloride 104 98 - 111 mmol/L   CO2 20 (L) 22 - 32 mmol/L   Glucose, Bld 103 (H) 70 - 99 mg/dL    Comment: Glucose reference range applies only to samples taken after fasting for at least 8 hours.   BUN 21 8 - 23 mg/dL   Creatinine, Ser 2.54 (H) 0.44 - 1.00 mg/dL   Calcium 9.1 8.9 - 27.0 mg/dL   GFR calc non Af Amer 53 (L) >60 mL/min   GFR calc Af Amer >60 >60 mL/min   Anion gap 16 (H) 5 - 15    Comment: Performed at [facility], [address], [place], [place] [address]  CBC     Status: Abnormal   Collection Time: 05/15/20 11:36 AM  Result Value Ref Range   WBC 11.0 (H) 4.0 - 10.5 K/uL   RBC 4.55 3.87 - 5.11 MIL/uL   Hemoglobin 12.1 12.0 - 15.0 g/dL   HCT 28.3 15.1 - 76.1 %   MCV 89.7 80.0 - 100.0 fL   MCH 26.6 26.0 - 34.0 pg   MCHC  29.7 (L) 30.0 - 36.0 g/dL   RDW 60.7 (H) 37.1 - 06.2 %   Platelets 293 150 - 400 K/uL   nRBC 0.0 0.0 - 0.2 %    Comment: Performed at [facility], [address] [place], [place] [address]   No results found.  Review of Systems  Musculoskeletal: Positive for neck pain.  Neurological: Positive for weakness and numbness.    Blood pressure 118/67, pulse 64, temperature (!) 97.1 F (36.2 C), temperature source Tympanic, resp. rate 18, height 5\' 9"  (1.753 m), weight 101.6 kg, SpO2 97 %. Physical Exam  Constitutional: She is oriented to person, place, and time. She appears well-developed.  HENT:  Head: Normocephalic.  Eyes: Pupils are equal, round, and reactive to light.  Respiratory: Effort normal.  GI: Soft.  Musculoskeletal:        General: Normal range of motion.     Cervical back: Normal range of motion.  Neurological: She is alert and oriented to person, place, and time. She has normal strength. GCS eye subscore is 4. GCS verbal subscore is 5. GCS motor subscore is 6.  Patient is awake and alert strength is 5 out of 5 left upper extremity right upper extremity has weakness in grip and hand intrinsics slight weakness right tricep 4+ out of 5 lower extremity strength 5 out of 5  Skin: Skin is warm and dry.     Assessment/Plan 69 year old presents for posterior cervical laminectomy foraminotomies at 6 7 on the right and C7-T1 on the right  Errica Dutil P, MD 05/15/2020, 1:09 PM

## 2020-05-15 NOTE — Op Note (Signed)
Preoperative diagnosis: Cervical radiculopathy C7-C8 right from cervical spinal stenosis and foraminal stenosis at C6-7 and C7-T1 on the right.  Postoperative diagnosis: Same  Procedure: Right-sided posterior cervical laminectomy foraminotomies of the C7 and C8 nerve roots with microdissection of the C7 and C8 nerve roots and utilization of operating microscope.  Surgeon: Jillyn Hidden Tevin Shillingford  Assistant: Julien Girt  Anesthesia: General  EBL: Minimal  HPI: 69 year old female with severe neck right shoulder and arm pain with weakness in her right hand.  Work-up revealed severe cervical stenosis and foraminal stenosis at C7 and C8 on the right due to patient progression of clinical syndrome imaging findings and failed conservative treatment I recommended posterior cervical foraminotomies of the 2 nerve roots.  I extensively went over the risks and benefits of that operation with her as well as perioperative course expectations of outcome and alternatives of surgery and she understood and agreed to proceed forward.  Operative procedure: Patient was brought into the OR was due to general anesthesia positioned prone in pins the backside of her neck was shaved prepped and draped in routine sterile fashion.  Utilizing anatomical landmarks drew out a midline incision and infiltrated with 8 cc lidocaine with epi and made a midline incision then dissected down to the fascia the fascia was divided on the right side subperiosteal dissection was carried lamina of C5-C6-C7 and T1 on the right side.  Intraoperative x-ray confirmed identification of the C5-6 disc base so attention was taken for laminotomies below this at C6-7 and C7-T1.  Laminotomy was begun on the right with a high-speed drill inferior aspect lamina C6 medial facet complex superior aspect of lamina of C7 as well as inferior C7 medial facet and superior T1.  Then laminotomy was begun with a 2 mm Kerrison punch ligament fibers identified and removed in  piecemeal fashion exposing thecal sac.  Under microscopic lamination first working at C7T1 under bit the medial facet identified the see 7.  Pedicle as well as a T1 pedicle localized in the C8 foramen.  Then I unroofed the C8 foramen tract track in the C8 nerve root out widely decompressing the foramen.  I exposed the disc base under the C8 nerve root there was not any disc herniation at this level most of stenosis was all foraminal and due to marked facet arthropathy.  I then tension taken at C6-7 and C6-7 in similar fashion identified the C6 and C7 pedicles and tract out the C7 nerve root above the C7 pedicle looked at the disc base there was a calcified discs with disc bulge there that I was not able to remove I did unroofed the foramen worked up underneath the nerve root freed up the C7 nerve root to where there is no further stenosis on it retracted inferiorly along the C7 pedicle to the proximal takeoff of the C8 nerve root and confirmed that there is no further stenosis on either nerve root.  Wound was then copiously irrigated meticulous hemostasis was maintained Gelfoam was overlaid top of the dura the muscle fascia approximate layers with Vicryl and the skin was closed running 4 subcuticular Dermabond benzoin Steri-Strips and a sterile dressing was applied patient recovery in stable condition.  At the end the case all needle counts and sponge counts were correct.

## 2020-05-15 NOTE — Anesthesia Postprocedure Evaluation (Signed)
Anesthesia Post Note  Patient: Diana Mcdonald  Procedure(s) Performed: Posterior Cervical Laminectomy- Cervical Six- Cervical Seven - Cervical Seven- Thoracic One - right (Right )     Patient location during evaluation: PACU Anesthesia Type: General Level of consciousness: awake and alert Pain management: pain level controlled Vital Signs Assessment: post-procedure vital signs reviewed and stable Respiratory status: spontaneous breathing, nonlabored ventilation, respiratory function stable and patient connected to nasal cannula oxygen Cardiovascular status: blood pressure returned to baseline and stable Postop Assessment: no apparent nausea or vomiting Anesthetic complications: no    Last Vitals:  Vitals:   05/15/20 1735 05/15/20 1750  BP: (!) 122/58 125/66  Pulse: 89 74  Resp: 11 15  Temp: 36.4 C   SpO2: 92% 97%    Last Pain:  Vitals:   05/15/20 1750  TempSrc:   PainSc: 5                  Diana Mcdonald

## 2020-05-15 NOTE — Anesthesia Preprocedure Evaluation (Addendum)
Anesthesia Evaluation  Patient identified by MRN, date of birth, ID band Patient awake    Reviewed: Allergy & Precautions, NPO status , Patient's Chart, lab work & pertinent test results  History of Anesthesia Complications (+) PONV and history of anesthetic complications  Airway Mallampati: I  TM Distance: >3 FB Neck ROM: Limited    Dental  (+) Teeth Intact, Dental Advisory Given   Pulmonary neg pulmonary ROS,    Pulmonary exam normal        Cardiovascular hypertension, Pt. on home beta blockers and Pt. on medications  Rhythm:Regular Rate:Normal     Neuro/Psych negative neurological ROS  negative psych ROS   GI/Hepatic Neg liver ROS, GERD  Medicated,  Endo/Other  diabetes, Type 2, Oral Hypoglycemic Agents  Renal/GU negative Renal ROS     Musculoskeletal negative musculoskeletal ROS (+)   Abdominal Normal abdominal exam  (+)   Peds  Hematology negative hematology ROS (+)   Anesthesia Other Findings RUE weakness  Reproductive/Obstetrics                           Anesthesia Physical Anesthesia Plan  ASA: II  Anesthesia Plan: General   Post-op Pain Management:    Induction: Intravenous  PONV Risk Score and Plan: 4 or greater and Ondansetron, Dexamethasone, Midazolam and Scopolamine patch - Pre-op  Airway Management Planned: Oral ETT and Video Laryngoscope Planned  Additional Equipment: None  Intra-op Plan:   Post-operative Plan: Extubation in OR  Informed Consent: I have reviewed the patients History and Physical, chart, labs and discussed the procedure including the risks, benefits and alternatives for the proposed anesthesia with the patient or authorized representative who has indicated his/her understanding and acceptance.     Dental advisory given  Plan Discussed with: CRNA  Anesthesia Plan Comments:         Anesthesia Quick Evaluation

## 2020-05-15 NOTE — Anesthesia Procedure Notes (Signed)
Procedure Name: Intubation Date/Time: 05/15/2020 3:44 PM Performed by: Modena Morrow, CRNA Pre-anesthesia Checklist: Patient identified, Emergency Drugs available, Suction available and Patient being monitored Patient Re-evaluated:Patient Re-evaluated prior to induction Oxygen Delivery Method: Circle system utilized Preoxygenation: Pre-oxygenation with 100% oxygen Induction Type: IV induction Ventilation: Mask ventilation without difficulty Laryngoscope Size: Glidescope and 4 Grade View: Grade I Tube type: Oral Tube size: 7.0 mm Number of attempts: 1 Airway Equipment and Method: Stylet and Oral airway Placement Confirmation: ETT inserted through vocal cords under direct vision,  positive ETCO2 and breath sounds checked- equal and bilateral Secured at: 22 cm Tube secured with: Tape Dental Injury: Teeth and Oropharynx as per pre-operative assessment

## 2020-05-16 ENCOUNTER — Encounter (HOSPITAL_COMMUNITY): Payer: Self-pay | Admitting: Neurosurgery

## 2020-05-16 DIAGNOSIS — M4802 Spinal stenosis, cervical region: Secondary | ICD-10-CM | POA: Diagnosis not present

## 2020-05-16 MED ORDER — HYDROMORPHONE HCL 2 MG PO TABS: 2.0000 mg | ORAL_TABLET | ORAL | 0 refills | Status: AC | PRN

## 2020-05-16 MED ORDER — METHOCARBAMOL 750 MG PO TABS
750.0000 mg | ORAL_TABLET | Freq: Four times a day (QID) | ORAL | 0 refills | Status: AC
Start: 2020-05-16 — End: ?

## 2020-05-16 NOTE — Evaluation (Signed)
Occupational Therapy Evaluation and Discharge Patient Details Name: Diana Mcdonald MRN: [id number] DOB: [birth year] Today's Date: 05/16/2020    History of Present Illness Pt is a 69 y/o female who presents C6-T1 posterior cervical laminectomy on 05/15/2020. PMH significant for HTN, DVT, restless leg, previous back sx (most recently was L4-L5 PLIF in May).   Clinical Impression   Pt with incoordination and weakness in R UE. Educated in home exercise program including med soft theraputty, squeeze ball and 1-2 lb weight for elbow/forearm/wrist. Pt verbalized understanding. Issued foam build ups for eating utensils and toothbrush. Pt is overall functioning modified independently in ADL and mobility. She has a supportive husband to assist her at home.    Follow Up Recommendations  No OT follow up;Supervision - Intermittent    Equipment Recommendations  None recommended by OT    Recommendations for Other Services       Precautions / Restrictions Precautions Precautions: Fall;Back Precaution Booklet Issued: Yes (comment) Precaution Comments: Reviewed precautions as they related to ADL and IADL Required Braces or Orthoses: Spinal Brace;Cervical Brace Cervical Brace: Soft collar Spinal Brace: Lumbar corset      Mobility Bed Mobility            General bed mobility comments: pt received in chair  Transfers Overall transfer level: Modified independent Equipment used: None Transfers: Sit to/from Stand           General transfer comment: increased time    Balance Overall balance assessment: Needs assistance Sitting-balance support: Feet supported Sitting balance-Leahy Scale: Good     Standing balance support: During functional activity Standing balance-Leahy Scale: Fair                             ADL either performed or assessed with clinical judgement   ADL Overall ADL's : Modified independent                                              Vision         Perception     Praxis      Pertinent Vitals/Pain Pain Assessment: Faces Faces Pain Scale: Hurts even more Pain Location: incision site Pain Descriptors / Indicators: Aching;Sore Pain Intervention(s): Monitored during session;Repositioned     Hand Dominance Right   Extremity/Trunk Assessment Upper Extremity Assessment Upper Extremity Assessment: RUE deficits/detail RUE Deficits / Details: decreased strength and coordination distally, educated pt in use of foam build ups for utensils and in HEP with theraputty and squeeze ball. RUE Sensation: decreased light touch RUE Coordination: decreased fine motor   Lower Extremity Assessment Lower Extremity Assessment: Overall WFL for tasks assessed   Cervical / Trunk Assessment Cervical / Trunk Assessment: Other exceptions Cervical / Trunk Exceptions: s/p cervical surgery, prior lumbar surgery   Communication Communication Communication: No difficulties   Cognition Arousal/Alertness: Awake/alert Behavior During Therapy: WFL for tasks assessed/performed Overall Cognitive Status: Within Functional Limits for tasks assessed                                     General Comments       Exercises     Shoulder Instructions      Home Living Family/patient expects to be discharged to:: Private residence Living Arrangements:  Spouse/significant other Available Help at Discharge: Family;Available 24 hours/day Type of Home: House Home Access: Stairs to enter [employer] of Steps: 4 Entrance Stairs-Rails: Right Home Layout: Able to live on main level with bedroom/bathroom     Bathroom Shower/Tub: Producer, television/film/video: Handicapped height     Home Equipment: Cane - single point;Walker - 2 wheels;Shower seat          Prior Functioning/Environment Level of Independence: Needs assistance    ADL's / Homemaking Assistance Needed: husband has been helping with  housekeeping since pt's back sx            OT Problem List:        OT Treatment/Interventions:      OT Goals(Current goals can be found in the care plan section) Acute Rehab OT Goals Patient Stated Goal: be able to cut her own food and do her own hair OT Goal Formulation: All assessment and education complete, DC therapy  OT Frequency:     Barriers to D/C:            Co-evaluation              AM-PAC OT "6 Clicks" Daily Activity     Outcome Measure Help from another person eating meals?: None Help from another person taking care of personal grooming?: None Help from another person toileting, which includes using toliet, bedpan, or urinal?: None Help from another person bathing (including washing, rinsing, drying)?: None Help from another person to put on and taking off regular upper body clothing?: None Help from another person to put on and taking off regular lower body clothing?: None 6 Click Score: 24   End of Session    Activity Tolerance: Patient tolerated treatment well Patient left: in chair;with call bell/phone within reach;with family/visitor present  OT Visit Diagnosis: Unsteadiness on feet (R26.81);Other abnormalities of gait and mobility (R26.89);Pain;History of falling (Z91.81);Muscle weakness (generalized) (M62.81)                Time: 6270-3500 OT Time Calculation (min): 23 min Charges:  OT General Charges $OT Visit: 1 Visit OT Evaluation $OT Eval Low Complexity: 1 Low OT Treatments $Therapeutic Exercise: 8-22 mins  Martie Round, OTR/L Acute Rehabilitation Services Pager: [phone] Office: [phone]  Evern Bio 05/16/2020, 10:42 AM

## 2020-05-16 NOTE — Evaluation (Signed)
Physical Therapy Evaluation and Discharge Patient Details Name: Diana Mcdonald MRN: [id number] DOB: [birth year] Today's Date: 05/16/2020   History of Present Illness  Pt is a 69 y/o female who presents C6-T1 posterior cervical laminectomy on 05/15/2020. PMH significant for HTN, DVT, restless leg, previous back sx (most recently was L4-L5 PLIF in May).  Clinical Impression  Patient evaluated by Physical Therapy with no further acute PT needs identified. All education has been completed and the patient has no further questions. Pt was able to demonstrate transfers and ambulation with gross modified independence. Pt was educated on precautions, brace application/wearing schedule, appropriate activity progression, and car transfer. See below for any follow-up Physical Therapy or equipment needs. PT is signing off. Thank you for this referral.     Follow Up Recommendations No PT follow up    Equipment Recommendations  None recommended by PT    Recommendations for Other Services       Precautions / Restrictions Precautions Precautions: Fall;Back Precaution Booklet Issued: Yes (comment) Precaution Comments: Reviewed precautions as they related to new cervical surgery. Pt was familiar with back precautions from recent lumbar fusion.  Required Braces or Orthoses: Spinal Brace Spinal Brace: Lumbar corset      Mobility  Bed Mobility Overal bed mobility: Modified Independent Bed Mobility: Sidelying to Sit;Rolling           General bed mobility comments: HOB flat and rails lowered to simulate home environment. Pt was able to transition to EOB without difficulty. Noted pt using LUE primarily to push herself up due to RUE being painful and weak.   Transfers Overall transfer level: Modified independent Equipment used: None Transfers: Sit to/from Stand           General transfer comment: Increased time but with good form and no unsteadiness/LOB  Ambulation/Gait Ambulation/Gait  assistance: Modified independent (Device/Increase time) Gait Distance (Feet): 300 Feet Assistive device: None Gait Pattern/deviations: Step-through pattern;Decreased stride length Gait velocity: decreased Gait velocity interpretation: <1.8 ft/sec, indicate of risk for recurrent falls General Gait Details: Guarded initially but pt was able to relax upper body and demonstrate arm swing as gait training progressed.   Stairs Stairs: Yes Stairs assistance: Supervision Stair Management: One rail Right;Step to pattern;Forwards Number of Stairs: 4 General stair comments: no instability or LOB  Wheelchair Mobility    Modified Rankin (Stroke Patients Only)       Balance Overall balance assessment: Needs assistance Sitting-balance support: Feet supported Sitting balance-Leahy Scale: Good     Standing balance support: During functional activity Standing balance-Leahy Scale: Fair                               Pertinent Vitals/Pain Pain Assessment: Faces Faces Pain Scale: Hurts even more Pain Location: incision site Pain Descriptors / Indicators: Aching;Sore Pain Intervention(s): Limited activity within patient's tolerance;Monitored during session;Repositioned    Home Living Family/patient expects to be discharged to:: Private residence Living Arrangements: Spouse/significant other Available Help at Discharge: Family;Available 24 hours/day Type of Home: House Home Access: Stairs to enter   [employer] of Steps: 4 Home Layout: Able to live on main level with bedroom/bathroom Home Equipment: Cane - single point;Walker - 2 wheels;Shower seat      Prior Function Level of Independence: Independent               Hand Dominance        Extremity/Trunk Assessment   Upper Extremity Assessment Upper  Extremity Assessment: RUE deficits/detail RUE Deficits / Details: Decreased fine motor and decreased strength. Pt reports having difficulty using  utensils to eat, brushing her hair, rolling her hair, etc. Pt reports numbness in ring finger and pinky finger. Asked RN for OT consult. RUE Sensation: decreased light touch RUE Coordination: decreased fine motor    Lower Extremity Assessment Lower Extremity Assessment: Overall WFL for tasks assessed    Cervical / Trunk Assessment Cervical / Trunk Assessment: Other exceptions Cervical / Trunk Exceptions: s/p cervical surgery, prior lumbar surgery  Communication   Communication: No difficulties  Cognition Arousal/Alertness: Awake/alert Behavior During Therapy: WFL for tasks assessed/performed Overall Cognitive Status: Within Functional Limits for tasks assessed                                        General Comments      Exercises     Assessment/Plan    PT Assessment Patent does not need any further PT services  PT Problem List         PT Treatment Interventions      PT Goals (Current goals can be found in the Care Plan section)  Acute Rehab PT Goals Patient Stated Goal: be able to cut her own food and do her own hair PT Goal Formulation: All assessment and education complete, DC therapy    Frequency     Barriers to discharge        Co-evaluation               AM-PAC PT "6 Clicks" Mobility  Outcome Measure Help needed turning from your back to your side while in a flat bed without using bedrails?: None Help needed moving from lying on your back to sitting on the side of a flat bed without using bedrails?: None Help needed moving to and from a bed to a chair (including a wheelchair)?: None Help needed standing up from a chair using your arms (e.g., wheelchair or bedside chair)?: None Help needed to walk in hospital room?: None Help needed climbing 3-5 steps with a railing? : A Little 6 Click Score: 23    End of Session Equipment Utilized During Treatment: Back brace Activity Tolerance: Patient tolerated treatment well Patient left: in  bed;with call bell/phone within reach;with family/visitor present;Other (comment) (seated EOB) Nurse Communication: Mobility status PT Visit Diagnosis: Other abnormalities of gait and mobility (R26.89);Pain Pain - Right/Left: Right Pain - part of body: Arm (neck)    Time: 9381-8299 PT Time Calculation (min) (ACUTE ONLY): 21 min   Charges:   PT Evaluation $PT Eval Low Complexity: 1 Low          Rolinda Roan, PT, DPT Acute Rehabilitation Services Pager: [phone] Office: [phone]   Thelma Comp 05/16/2020, 9:45 AM

## 2020-05-16 NOTE — Plan of Care (Signed)
Patient alert and oriented, mae's well, voiding adequate amount of urine, swallowing without difficulty, c/o pain at time of discharge and medication given. Patient discharged home with family. Script and discharged instructions given to patient. Patient and family stated understanding of instructions given. Patient has an appointment with Dr. Wynetta Emery.

## 2020-05-16 NOTE — Discharge Summary (Signed)
Physician Discharge Summary  Patient ID: Diana Mcdonald MRN: [id number] DOB/AGE: 04-03-1951 69 y.o.  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    Discharge Diagnoses: same   Discharged Condition: good  Hospital Course: The patient was admitted on 05/15/2020 and taken to the operating room where the patient underwent posterior cervical decompression C6-7, C7-T1. The patient tolerated the procedure well and was taken to the recovery room and then to the floor in stable condition. The hospital course was routine. There were no complications. The wound remained clean dry and intact. Pt had appropriate neck soreness. No complaints of arm pain or new N/T/W. The patient remained afebrile with stable vital signs, and tolerated a regular diet. The patient continued to increase activities, and pain was well controlled with oral pain medications.   Consults: None  Significant Diagnostic Studies:  Results for orders placed or performed during the hospital encounter of 05/15/20  SARS Coronavirus 2 by RT PCR (hospital order, performed in [facility] hospital lab) Nasopharyngeal Nasopharyngeal Swab   Specimen: Nasopharyngeal Swab  Result Value Ref Range   SARS Coronavirus 2 NEGATIVE NEGATIVE  Glucose, capillary  Result Value Ref Range   Glucose-Capillary 109 (H) 70 - 99 mg/dL  Basic metabolic panel  Result Value Ref Range   Sodium 140 135 - 145 mmol/L   Potassium 5.8 (H) 3.5 - 5.1 mmol/L   Chloride 104 98 - 111 mmol/L   CO2 20 (L) 22 - 32 mmol/L   Glucose, Bld 103 (H) 70 - 99 mg/dL   BUN 21 8 - 23 mg/dL   Creatinine, Ser 7.62 (H) 0.44 - 1.00 mg/dL   Calcium 9.1 8.9 - 26.3 mg/dL   GFR calc non Af Amer 53 (L) >60 mL/min   GFR calc Af Amer >60 >60 mL/min   Anion gap 16 (H) 5 - 15  CBC  Result Value Ref Range   WBC 11.0 (H) 4.0 - 10.5 K/uL   RBC  4.55 3.87 - 5.11 MIL/uL   Hemoglobin 12.1 12.0 - 15.0 g/dL   HCT 33.5 36 - 46 %   MCV 89.7 80.0 - 100.0 fL   MCH 26.6 26.0 - 34.0 pg   MCHC 29.7 (L) 30.0 - 36.0 g/dL   RDW 45.6 (H) 25.6 - 38.9 %   Platelets 293 150 - 400 K/uL   nRBC 0.0 0.0 - 0.2 %    DG Chest 2 View  Result Date: 04/23/2020 CLINICAL DATA:  Right upper back pain. EXAM: CHEST - 2 VIEW COMPARISON:  None. FINDINGS: The heart size and mediastinal contours are within normal limits. Both lungs are clear. No pneumothorax or pleural effusion is noted. The visualized skeletal structures are unremarkable. IMPRESSION: No active cardiopulmonary disease. Electronically Signed   By: Lupita Raider M.D.   On: 04/23/2020 11:54   DG Cervical Spine 1 View  Result Date: 05/15/2020 CLINICAL DATA:  Posterior cervical laminectomy C6-C7 C7-T1. EXAM: DG CERVICAL SPINE - 1 VIEW; DG C-ARM 1-60 MIN COMPARISON:  None. FINDINGS: Single lateral fluoroscopic spot view of the cervical spine. Surgical instrument localizes posterior to C5-C6. Limited evaluation given penetration on this single fluoroscopic spot view. Total fluoroscopy time 14 seconds. Dose 6.58 mGy. IMPRESSION: Single lateral fluoroscopic spot view of the cervical spine during cervical surgery. Electronically Signed   By: Narda Rutherford M.D.   On: 05/15/2020 18:48   DG Lumbar  Spine 2-3 Views  Result Date: 04/24/2020 CLINICAL DATA:  L4-5 lumbar interbody fusion. EXAM: LUMBAR SPINE - 2-3 VIEW; DG C-ARM 1-60 MIN COMPARISON:  10/18/2019 FINDINGS: Multiple C-arm images show posterior decompression, diskectomy and fusion procedure in progress at L4-5. Interbody spacer and bilateral pedicle screws appear grossly well positioned. Posterior rods not yet placed. IMPRESSION: PLIF in progress L4-5.  Good radiographic appearance. Electronically Signed   By: Paulina Fusi M.D.   On: 04/24/2020 11:28   DG C-Arm 1-60 Min  Result Date: 05/15/2020 CLINICAL DATA:  Posterior cervical laminectomy C6-C7 C7-T1.  EXAM: DG CERVICAL SPINE - 1 VIEW; DG C-ARM 1-60 MIN COMPARISON:  None. FINDINGS: Single lateral fluoroscopic spot view of the cervical spine. Surgical instrument localizes posterior to C5-C6. Limited evaluation given penetration on this single fluoroscopic spot view. Total fluoroscopy time 14 seconds. Dose 6.58 mGy. IMPRESSION: Single lateral fluoroscopic spot view of the cervical spine during cervical surgery. Electronically Signed   By: Narda Rutherford M.D.   On: 05/15/2020 18:48   DG C-Arm 1-60 Min  Result Date: 04/24/2020 CLINICAL DATA:  L4-5 lumbar interbody fusion. EXAM: LUMBAR SPINE - 2-3 VIEW; DG C-ARM 1-60 MIN COMPARISON:  10/18/2019 FINDINGS: Multiple C-arm images show posterior decompression, diskectomy and fusion procedure in progress at L4-5. Interbody spacer and bilateral pedicle screws appear grossly well positioned. Posterior rods not yet placed. IMPRESSION: PLIF in progress L4-5.  Good radiographic appearance. Electronically Signed   By: Paulina Fusi M.D.   On: 04/24/2020 11:28    Antibiotics:  Anti-infectives (From admission, onward)   Start     Dose/Rate Route Frequency Ordered Stop   05/15/20 1900  ceFAZolin (ANCEF) IVPB 2g/100 mL premix        2 g 200 mL/hr over 30 Minutes Intravenous Every 8 hours 05/15/20 1853 05/16/20 0402   05/15/20 1613  bacitracin 50,000 Units in sodium chloride 0.9 % 500 mL irrigation  Status:  Discontinued          As needed 05/15/20 1613 05/15/20 1729   05/15/20 1145  ceFAZolin (ANCEF) IVPB 2g/100 mL premix        2 g 200 mL/hr over 30 Minutes Intravenous On call to O.R. 05/15/20 1132 05/15/20 1540      Discharge Exam: Blood pressure 123/61, pulse 61, temperature 98.6 F (37 C), temperature source Oral, resp. rate 18, height 5\' 9"  (1.753 m), weight 101.6 kg, SpO2 97 %. Neurologic: Grossly normal ambualting and voiding well  Discharge Medications:   Allergies as of 05/16/2020   No Known Allergies     Medication List    TAKE these  medications   acetaminophen 650 MG CR tablet Commonly known as: TYLENOL Take 650 mg by mouth every 8 (eight) hours as needed for pain.   aspirin EC 81 MG tablet Take 81 mg by mouth daily.   atenolol 25 MG tablet Commonly known as: TENORMIN Take 25 mg by mouth at bedtime.   CALCIUM 1200 PO Take 1,200 tablets by mouth daily.   cetirizine 10 MG tablet Commonly known as: ZYRTEC Take 10 mg by mouth daily.   diazepam 2 MG tablet Commonly known as: VALIUM Take 2 mg by mouth See admin instructions. Take 2 mg sublingually as needed for vertigo, may repeat every 30 minutes until symptoms improve.  Do not exceed 4 tablets within 8 hours.   gabapentin 300 MG capsule Commonly known as: NEURONTIN Take 300 mg by mouth 2 (two) times daily.   hydrochlorothiazide 25 MG tablet Commonly known as:  HYDRODIURIL Take 25 mg by mouth daily.   HYDROmorphone 2 MG tablet Commonly known as: Dilaudid Take 1 tablet (2 mg total) by mouth every 4 (four) hours as needed for up to 5 days for severe pain. What changed: additional instructions   lidocaine 5 % Commonly known as: LIDODERM Place 1 patch onto the skin daily. Remove & Discard patch within 12 hours or as directed by MD   Magnesium 250 MG Tabs Take 250 mg by mouth daily.   meclizine 25 MG tablet Commonly known as: ANTIVERT Take 25 mg by mouth 3 (three) times daily as needed for dizziness.   metFORMIN 500 MG 24 hr tablet Commonly known as: GLUCOPHAGE-XR Take 500 mg by mouth daily with supper.   methocarbamol 500 MG tablet Commonly known as: ROBAXIN Take 1 tablet (500 mg total) by mouth 2 (two) times daily. What changed: additional instructions   methocarbamol 500 MG tablet Commonly known as: Robaxin Take 1 tablet (500 mg total) by mouth 4 (four) times daily. What changed:   Another medication with the same name was added. Make sure you understand how and when to take each.  Another medication with the same name was changed. Make  sure you understand how and when to take each.   methocarbamol 750 MG tablet Commonly known as: Robaxin-750 Take 1 tablet (750 mg total) by mouth 4 (four) times daily. What changed: You were already taking a medication with the same name, and this prescription was added. Make sure you understand how and when to take each.   nystatin cream Commonly known as: MYCOSTATIN Apply 1 application topically in the morning and at bedtime.   omeprazole 40 MG capsule Commonly known as: PRILOSEC Take 1 capsule (40 mg total) by mouth 2 (two) times daily. Please take this twice a day while on steroid dosepak What changed: additional instructions   ondansetron 4 MG tablet Commonly known as: ZOFRAN Take 4 mg by mouth every 6 (six) hours as needed for nausea or vomiting.   oxyCODONE-acetaminophen 10-325 MG tablet Commonly known as: Percocet Take 1 tablet by mouth every 4 (four) hours as needed for pain.   rOPINIRole 0.5 MG tablet Commonly known as: REQUIP Take 1.5-2 mg by mouth every evening.   rosuvastatin 10 MG tablet Commonly known as: CRESTOR Take 10 mg by mouth at bedtime.   tiZANidine 4 MG tablet Commonly known as: ZANAFLEX Take 4 mg by mouth every 6 (six) hours as needed for muscle spasms.   vitamin C 500 MG tablet Commonly known as: ASCORBIC ACID Take 500 mg by mouth daily.   Vitamin D3 125 MCG (5000 UT) Caps Take 5,000 Units by mouth daily.       Disposition: home   Final Dx: posterior cervical decompression C6-7, C7-T1  Discharge Instructions     Remove dressing in 72 hours   Complete by: As directed    Call MD for:  difficulty breathing, headache or visual disturbances   Complete by: As directed    Call MD for:  hives   Complete by: As directed    Call MD for:  persistant dizziness or light-headedness   Complete by: As directed    Call MD for:  persistant nausea and vomiting   Complete by: As directed    Call MD for:  redness, tenderness, or signs of infection  (pain, swelling, redness, odor or green/yellow discharge around incision site)   Complete by: As directed    Call MD for:  severe uncontrolled pain  Complete by: As directed    Call MD for:  temperature >100.4   Complete by: As directed    Diet - low sodium heart healthy   Complete by: As directed    Driving Restrictions   Complete by: As directed    No driving for 2 weeks, no riding in the car for 1 week   Increase activity slowly   Complete by: As directed          Signed: Tiana Loft Ashly Yepez 05/16/2020, 7:51 AM

## 2021-07-01 ENCOUNTER — Other Ambulatory Visit: Payer: Self-pay | Admitting: Neurosurgery

## 2021-07-01 ENCOUNTER — Other Ambulatory Visit (HOSPITAL_COMMUNITY): Payer: Self-pay | Admitting: Neurosurgery

## 2021-07-01 DIAGNOSIS — M5126 Other intervertebral disc displacement, lumbar region: Secondary | ICD-10-CM

## 2021-07-02 ENCOUNTER — Other Ambulatory Visit: Payer: Self-pay

## 2021-07-02 ENCOUNTER — Ambulatory Visit (HOSPITAL_COMMUNITY)
Admission: RE | Admit: 2021-07-02 | Discharge: 2021-07-02 | Disposition: A | Discharge status: Home / Self Care | Payer: Medicare HMO | Source: Ambulatory Visit | Attending: Neurosurgery | Admitting: Neurosurgery

## 2021-07-02 DIAGNOSIS — M5126 Other intervertebral disc displacement, lumbar region: Secondary | ICD-10-CM

## 2021-07-02 MED ORDER — GADOBUTROL 1 MMOL/ML IV SOLN
10.0000 mL | Freq: Once | INTRAVENOUS | Status: AC | PRN
  Administered 2021-07-02: 10 mL via INTRAVENOUS

## 2022-09-12 LAB — COLOGUARD: COLOGUARD: POSITIVE — AB

## 2022-11-21 IMAGING — MR MR LUMBAR SPINE WO/W CM
4 of 5 series · 26 of 48 positions shown · IV contrast (gadavist)
Comparison: CT lumbar spine [DATE].  Lumbar MRI 02/13/2021.

CLINICAL DATA: 70-year-old female status post fall onto back on
[REDACTED]. Increasing low back pain. Prior surgery.

EXAM:
MRI LUMBAR SPINE WITHOUT AND WITH CONTRAST
TECHNIQUE: Multiplanar and multiecho pulse sequences of the lumbar spine were
obtained without and with intravenous contrast.
CONTRAST:  10mL GADAVIST GADOBUTROL 1 MMOL/ML IV SOLN

[Series 6: STIR · sagittal · 4.0mm · 0.51mm/px · 3 of 15 slices shown]
[im 1/15]
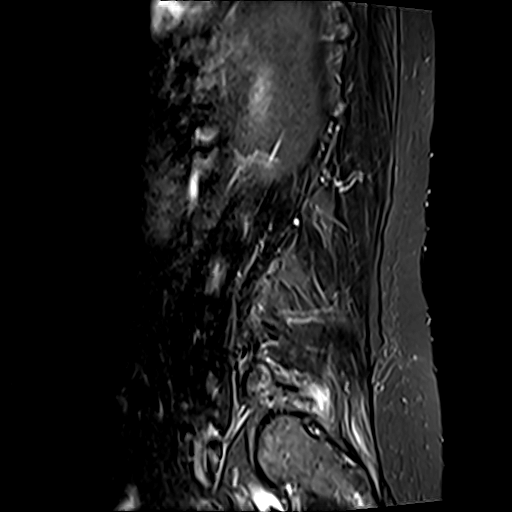
[im 10/15]
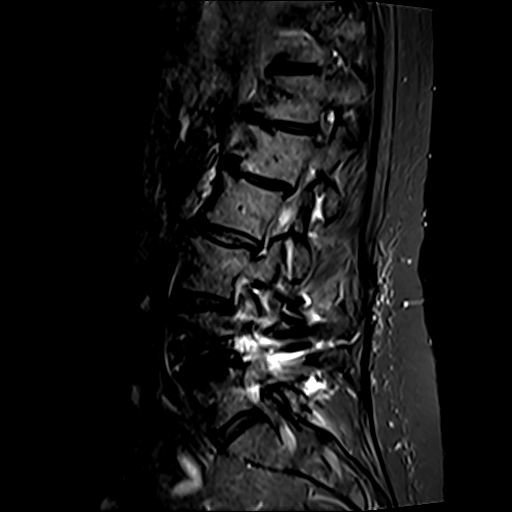
[im 15/15]
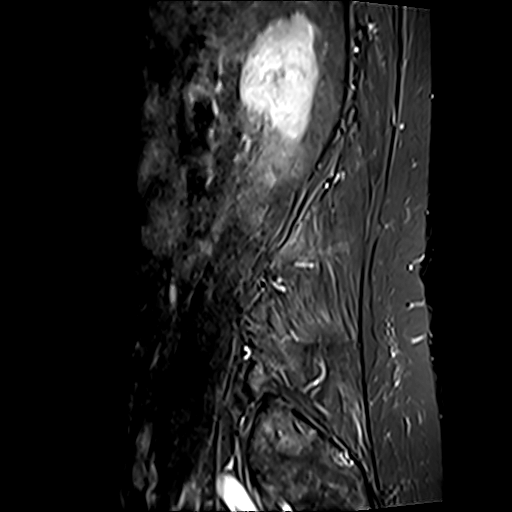

[Series 7: T2 · axial · 4.0mm · 0.62mm/px · z∈[-52,+157]mm · 9 of 38 slices shown]
[im 1/38]
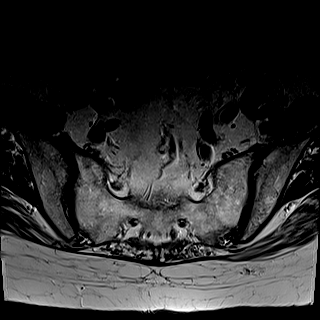
[im 7/38]
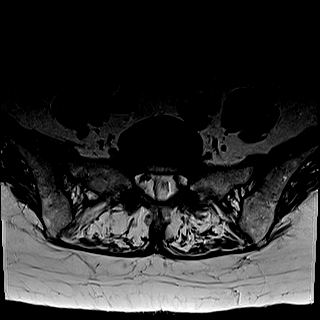
[im 13/38]
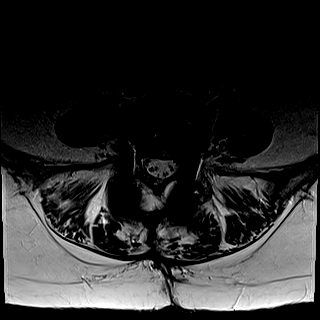
[im 16/38]
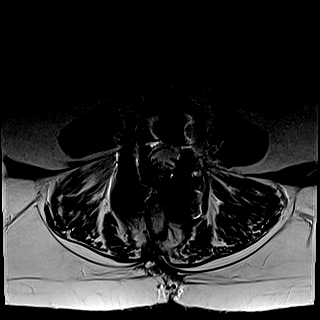
[im 19/38]
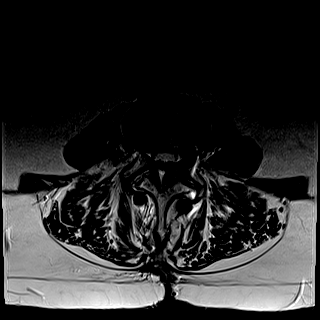
[im 22/38]
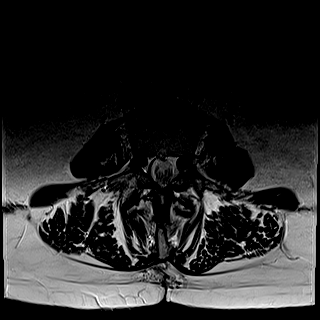
[im 25/38]
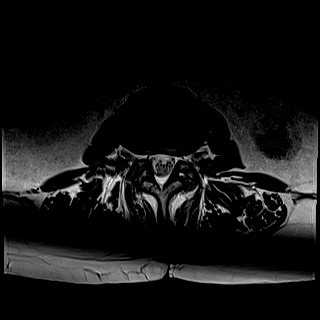
[im 31/38]
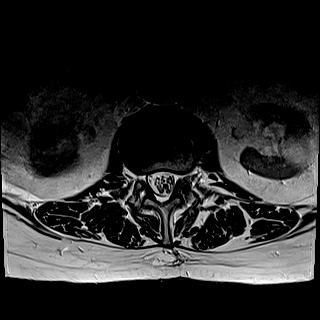
[im 38/38]
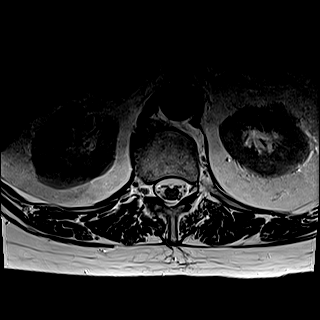

[Series 8: T1 · axial · 4.0mm · 0.39mm/px · z∈[-52,+157]mm · 9 of 38 slices shown]
[im 1/38]
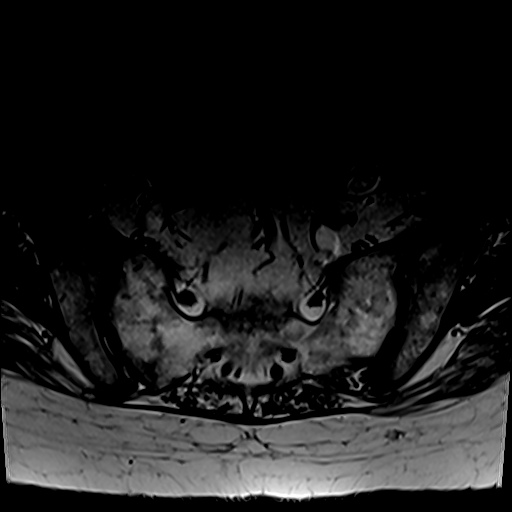
[im 7/38]
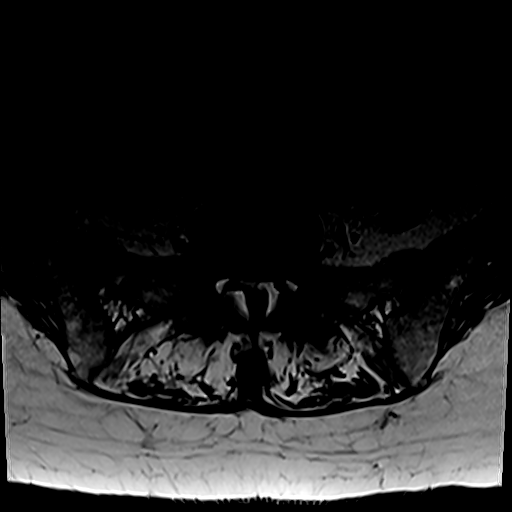
[im 13/38]
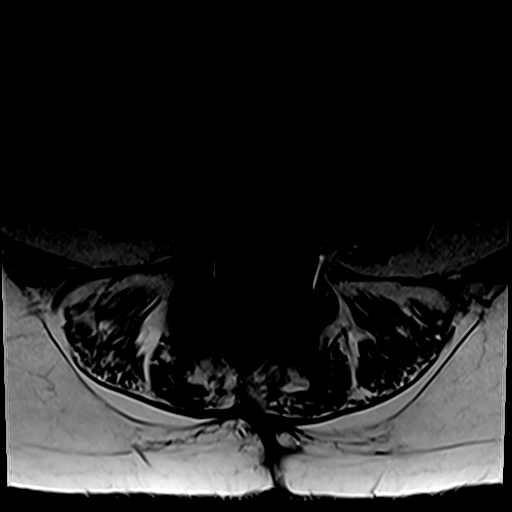
[im 16/38]
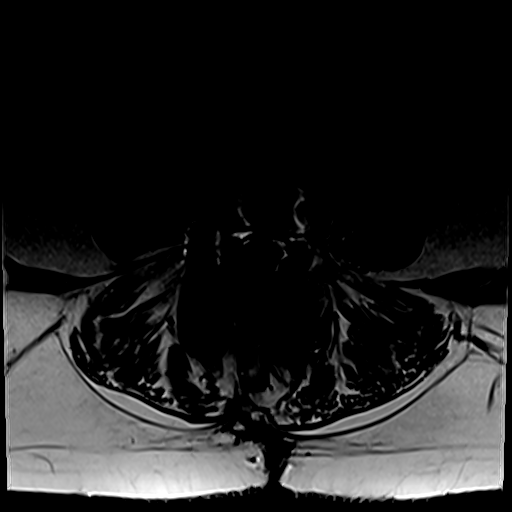
[im 19/38]
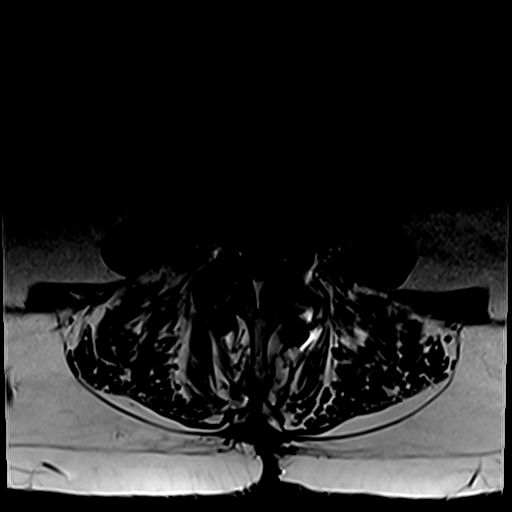
[im 22/38]
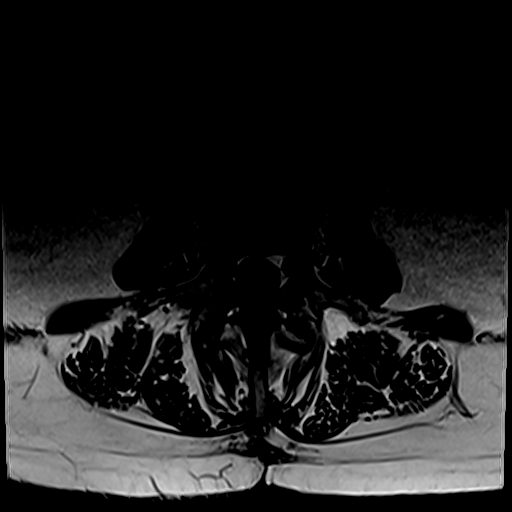
[im 25/38]
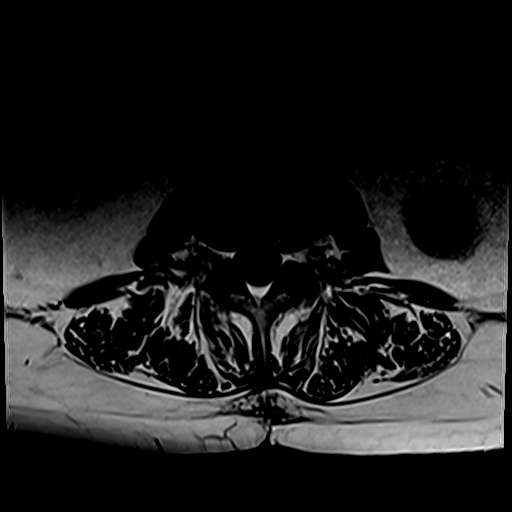
[im 31/38]
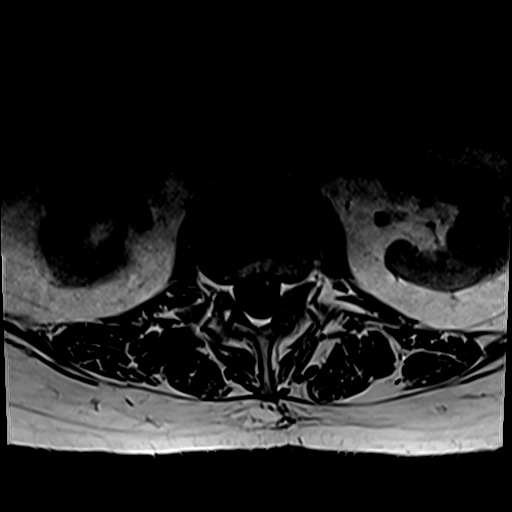
[im 38/38]
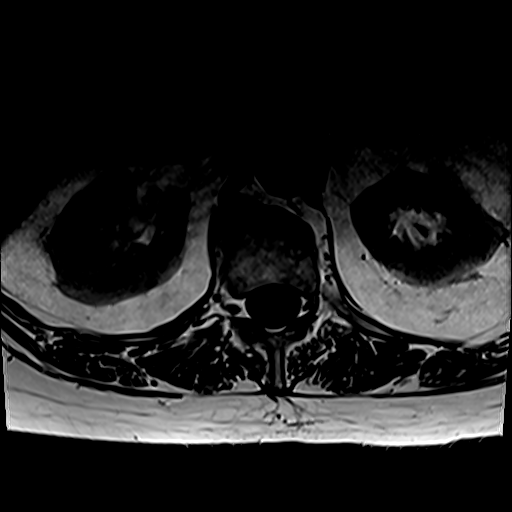

[Series 10: T1 fat-sat post-contrast · sagittal · 4.0mm · 0.81mm/px · 5 of 15 slices shown]
[im 1/15]
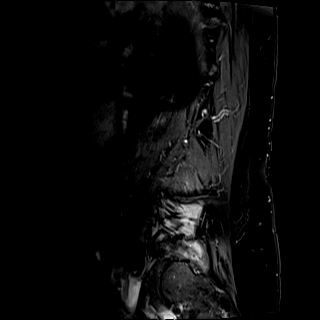
[im 4/15]
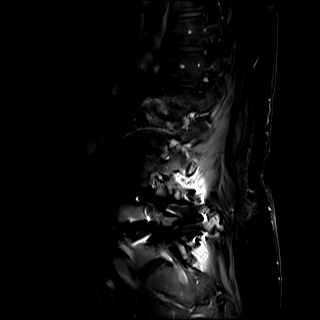
[im 8/15]
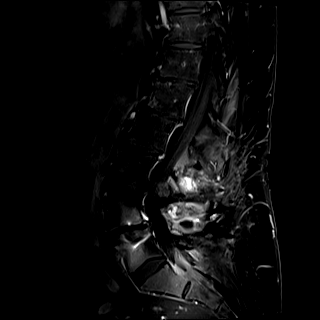
[im 11/15]
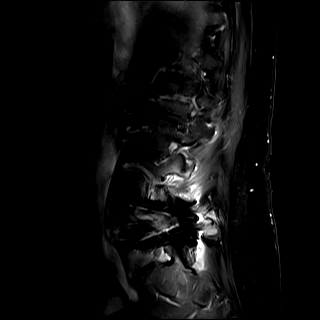
[im 15/15]
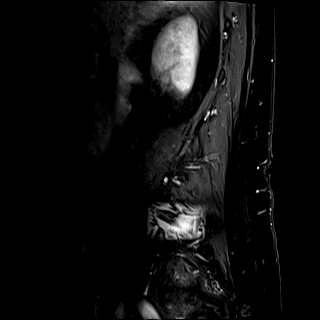

[26 of 48 positions shown; findings below may reference images not displayed]

FINDINGS: Segmentation: Normal, the same numbering system used on the
comparisons.

Alignment: Stable. Mild chronic spondylolisthesis at L4-L5 where
fusion hardware remains in place. Mild chronic retrolisthesis L1-L2
and L2-L3.

Vertebrae: Mild hardware susceptibility artifact at L4-L5. Patchy
degenerative appearing endplate marrow edema at L1 and superiorly at
L2. Normal background bone marrow signal. No recent lumbar
compression. No other marrow edema or acute osseous abnormality.
Intact visible sacrum and SI joints.

Conus medullaris and cauda equina: Conus extends to the T12-L1
level. No lower spinal cord or conus signal abnormality. No abnormal
intradural enhancement. No dural thickening.

Paraspinal and other soft tissues: Stable and negative visible
abdominal viscera. Postoperative changes to the lower lumbar
paraspinal soft tissues with no adverse features.

Disc levels:

Visible lower thoracic levels remain normal for age through T12-L1.

L1-L2: Increased disc space loss since 8686. Chronic vacuum disc
now. Left eccentric circumferential disc osteophyte complex. Left
subarticular component of disc. Moderate left L1 foraminal stenosis
has increased since 8686.

L2-L3: Broad-based chronic disc bulging eccentric to the left.
Stable mild facet and ligament flavum hypertrophy. No spinal or
convincing lateral recess stenosis. Stable L2 foramina without
convincing stenosis.

L3-L4: Increased moderate facet and ligament flavum hypertrophy.
Mild disc bulging eccentric to the left. New borderline to mild
spinal stenosis here since 8686. No lateral recess stenosis. Mild to
moderate bilateral L3 foraminal stenosis also appears increased.

L4-L5:  Sequelae of decompression and fusion.  No stenosis.

L5-S1: Negative disc. Moderate facet hypertrophy is stable. No
stenosis.
IMPRESSION: 1. No acute osseous abnormality in the lumbar spine. Sequelae of
decompression and fusion at L4-L5 with no adverse features.

2. Progressed disc and posterior element degeneration at the L3-L4
adjacent segment since 8686. New borderline to mild spinal stenosis
and increased mild to moderate bilateral foraminal stenosis.

3. Progressed L1-L2 disc degeneration and moderate left L1 foraminal
stenosis since 10/18/2019.

## 2024-07-06 ENCOUNTER — Encounter (HOSPITAL_COMMUNITY): Payer: Self-pay | Admitting: Neurosurgery

## 2024-07-10 ENCOUNTER — Encounter (HOSPITAL_COMMUNITY): Payer: Self-pay | Admitting: Neurosurgery

## 2024-07-11 ENCOUNTER — Other Ambulatory Visit (HOSPITAL_COMMUNITY): Payer: Self-pay | Admitting: Neurosurgery

## 2024-07-11 DIAGNOSIS — M542 Cervicalgia: Secondary | ICD-10-CM

## 2024-07-27 ENCOUNTER — Ambulatory Visit (HOSPITAL_COMMUNITY)
Admission: RE | Admit: 2024-07-27 | Discharge: 2024-07-27 | Disposition: A | Discharge status: Home / Self Care | Source: Ambulatory Visit | Attending: Neurosurgery | Admitting: Neurosurgery

## 2024-07-27 DIAGNOSIS — M542 Cervicalgia: Secondary | ICD-10-CM
